# Patient Record
Sex: Female | Born: 1975 | Race: White | Hispanic: No | Marital: Married | Smoking: Current every day smoker
Health system: Southern US, Community
[De-identification: ages and names within clinical notes are randomized; demographics above are authoritative.]

## PROBLEM LIST (undated history)

## (undated) DIAGNOSIS — R87629 Unspecified abnormal cytological findings in specimens from vagina: Secondary | ICD-10-CM

## (undated) DIAGNOSIS — R5383 Other fatigue: Secondary | ICD-10-CM

## (undated) DIAGNOSIS — F17200 Nicotine dependence, unspecified, uncomplicated: Secondary | ICD-10-CM

## (undated) DIAGNOSIS — R079 Chest pain, unspecified: Secondary | ICD-10-CM

## (undated) HISTORY — DX: Nicotine dependence, unspecified, uncomplicated: F17.200

## (undated) HISTORY — DX: Unspecified abnormal cytological findings in specimens from vagina: R87.629

## (undated) HISTORY — PX: OTHER SURGICAL HISTORY: SHX169

## (undated) HISTORY — DX: Chest pain, unspecified: R07.9

## (undated) HISTORY — PX: APPENDECTOMY: SHX54

## (undated) HISTORY — DX: Other fatigue: R53.83

---

## 2003-06-07 ENCOUNTER — Other Ambulatory Visit: Admission: RE | Admit: 2003-06-07 | Discharge: 2003-06-07 | Payer: Self-pay | Admitting: Obstetrics and Gynecology

## 2004-06-10 ENCOUNTER — Other Ambulatory Visit: Admission: RE | Admit: 2004-06-10 | Discharge: 2004-06-10 | Payer: Self-pay | Admitting: Obstetrics and Gynecology

## 2006-05-27 ENCOUNTER — Ambulatory Visit (HOSPITAL_COMMUNITY): Admission: RE | Admit: 2006-05-27 | Discharge: 2006-05-27 | Payer: Self-pay | Admitting: Preventative Medicine

## 2007-07-09 ENCOUNTER — Emergency Department (HOSPITAL_COMMUNITY): Admission: EM | Admit: 2007-07-09 | Discharge: 2007-07-09 | Payer: Self-pay | Admitting: Emergency Medicine

## 2007-11-23 ENCOUNTER — Other Ambulatory Visit: Admission: RE | Admit: 2007-11-23 | Discharge: 2007-11-23 | Payer: Self-pay | Admitting: Obstetrics and Gynecology

## 2007-12-06 ENCOUNTER — Ambulatory Visit (HOSPITAL_COMMUNITY): Admission: RE | Admit: 2007-12-06 | Discharge: 2007-12-06 | Payer: Self-pay | Admitting: Obstetrics & Gynecology

## 2008-03-27 ENCOUNTER — Emergency Department (HOSPITAL_COMMUNITY): Admission: EM | Admit: 2008-03-27 | Discharge: 2008-03-27 | Payer: Self-pay | Admitting: Emergency Medicine

## 2008-04-01 ENCOUNTER — Ambulatory Visit (HOSPITAL_COMMUNITY): Admission: RE | Admit: 2008-04-01 | Discharge: 2008-04-01 | Payer: Self-pay | Admitting: Pediatrics

## 2008-06-13 ENCOUNTER — Ambulatory Visit (HOSPITAL_COMMUNITY): Admission: RE | Admit: 2008-06-13 | Discharge: 2008-06-13 | Payer: Self-pay | Admitting: Pediatrics

## 2008-07-09 ENCOUNTER — Encounter (HOSPITAL_COMMUNITY): Admission: RE | Admit: 2008-07-09 | Discharge: 2008-08-08 | Payer: Self-pay | Admitting: Pediatrics

## 2008-11-10 ENCOUNTER — Emergency Department (HOSPITAL_COMMUNITY): Admission: EM | Admit: 2008-11-10 | Discharge: 2008-11-10 | Payer: Self-pay | Admitting: Emergency Medicine

## 2010-12-28 ENCOUNTER — Encounter: Payer: Self-pay | Admitting: Pediatrics

## 2011-08-31 LAB — PREGNANCY, URINE: Preg Test, Ur: NEGATIVE

## 2012-02-03 ENCOUNTER — Other Ambulatory Visit (HOSPITAL_COMMUNITY): Payer: Self-pay | Admitting: Pediatrics

## 2012-02-03 ENCOUNTER — Ambulatory Visit (HOSPITAL_COMMUNITY)
Admission: RE | Admit: 2012-02-03 | Discharge: 2012-02-03 | Disposition: A | Discharge status: Home / Self Care | Payer: Self-pay | Source: Ambulatory Visit | Attending: Pediatrics | Admitting: Pediatrics

## 2012-02-03 DIAGNOSIS — M549 Dorsalgia, unspecified: Secondary | ICD-10-CM

## 2012-02-03 DIAGNOSIS — M79609 Pain in unspecified limb: Secondary | ICD-10-CM | POA: Insufficient documentation

## 2012-02-03 DIAGNOSIS — M545 Low back pain, unspecified: Secondary | ICD-10-CM | POA: Insufficient documentation

## 2013-01-04 ENCOUNTER — Emergency Department (HOSPITAL_COMMUNITY)
Admission: EM | Admit: 2013-01-04 | Discharge: 2013-01-04 | Disposition: A | Discharge status: Home / Self Care | Payer: Self-pay | Attending: Emergency Medicine | Admitting: Emergency Medicine

## 2013-01-04 ENCOUNTER — Emergency Department (HOSPITAL_COMMUNITY): Payer: Self-pay

## 2013-01-04 ENCOUNTER — Encounter (HOSPITAL_COMMUNITY): Payer: Self-pay

## 2013-01-04 DIAGNOSIS — R6883 Chills (without fever): Secondary | ICD-10-CM | POA: Insufficient documentation

## 2013-01-04 DIAGNOSIS — R0982 Postnasal drip: Secondary | ICD-10-CM | POA: Insufficient documentation

## 2013-01-04 DIAGNOSIS — IMO0001 Reserved for inherently not codable concepts without codable children: Secondary | ICD-10-CM | POA: Insufficient documentation

## 2013-01-04 DIAGNOSIS — F17200 Nicotine dependence, unspecified, uncomplicated: Secondary | ICD-10-CM | POA: Insufficient documentation

## 2013-01-04 DIAGNOSIS — R079 Chest pain, unspecified: Secondary | ICD-10-CM | POA: Insufficient documentation

## 2013-01-04 DIAGNOSIS — J3489 Other specified disorders of nose and nasal sinuses: Secondary | ICD-10-CM | POA: Insufficient documentation

## 2013-01-04 MED ORDER — PROMETHAZINE-CODEINE 6.25-10 MG/5ML PO SYRP: 5.0000 mL | ORAL_SOLUTION | ORAL | Status: DC | PRN

## 2013-01-04 MED ORDER — PSEUDOEPHEDRINE HCL 60 MG PO TABS: ORAL_TABLET | ORAL | Status: DC

## 2013-01-04 NOTE — ED Provider Notes (Signed)
Medical screening examination/treatment/procedure(s) were performed by non-physician practitioner and as supervising physician I was immediately available for consultation/collaboration.   Hindy Perrault M Lynnox Girten, DO 01/04/13 1821 

## 2013-01-04 NOTE — ED Notes (Signed)
Pt reports cough/congestion, and sore throat for about a week.  Pt denies any fever at home.  Pt reports "i feel like i need to clear my throat but cant".

## 2013-01-04 NOTE — ED Provider Notes (Signed)
History     CSN: [id number]  Arrival date & time 01/04/13  1637   First MD Initiated Contact with Patient 01/04/13 1652      Chief Complaint  Patient presents with  . Cough  . Nasal Congestion    (Consider location/radiation/quality/duration/timing/severity/associated sxs/prior treatment) Patient is a 37 y.o. female presenting with cough. The history is provided by the patient.  Cough This is a new problem. The current episode started more than 2 days ago. The problem occurs every few minutes. The problem has not changed since onset.The cough is non-productive. There has been no fever. Associated symptoms include chest pain, chills, rhinorrhea and myalgias. Pertinent negatives include no shortness of breath and no wheezing. She has tried nothing for the symptoms. She is a smoker. Her past medical history is significant for bronchitis. Her past medical history does not include pneumonia or asthma.    History reviewed. No pertinent past medical history.  Past Surgical History  Procedure Date  . Appendectomy     No family history on file.  History  Substance Use Topics  . Smoking status: Current Every Day Smoker  . Smokeless tobacco: Not on file  . Alcohol Use: No    OB History    Grav Para Term Preterm Abortions TAB SAB Ect Mult Living                  Review of Systems  Constitutional: Positive for chills. Negative for activity change.       All ROS Neg except as noted in HPI  HENT: Positive for rhinorrhea. Negative for nosebleeds and neck pain.   Eyes: Negative for photophobia and discharge.  Respiratory: Positive for cough. Negative for shortness of breath and wheezing.   Cardiovascular: Positive for chest pain. Negative for palpitations.  Gastrointestinal: Negative for abdominal pain and blood in stool.  Genitourinary: Negative for dysuria, frequency and hematuria.  Musculoskeletal: Positive for myalgias. Negative for back pain and arthralgias.  Skin: Negative.    Neurological: Negative for dizziness, seizures and speech difficulty.  Psychiatric/Behavioral: Negative for hallucinations and confusion.    Allergies  Review of patient's allergies indicates no known allergies.  Home Medications  No current outpatient prescriptions on file.  BP 127/72  Pulse 83  Temp 97.9 F (36.6 C) (Oral)  Resp 17  Ht 5\' 3"  (1.6 m)  Wt 125 lb (56.7 kg)  BMI 22.14 kg/m2  SpO2 100%  LMP 12/21/2012  Physical Exam  Nursing note and vitals reviewed. Constitutional: She is oriented to person, place, and time. She appears well-developed and well-nourished.  Non-toxic appearance.  HENT:  Head: Normocephalic.  Right Ear: Tympanic membrane and external ear normal.  Left Ear: Tympanic membrane and external ear normal.       Airway patent. Uvula midline. No fb noted. Speech clear  Eyes: EOM and lids are normal. Pupils are equal, round, and reactive to light.  Neck: Normal range of motion. Neck supple. Carotid bruit is not present.       trachea midline  Cardiovascular: Normal rate, regular rhythm, normal heart sounds, intact distal pulses and normal pulses.   Pulmonary/Chest: Breath sounds normal. No respiratory distress.  Abdominal: Soft. Bowel sounds are normal. There is no tenderness. There is no guarding.  Musculoskeletal: Normal range of motion.  Lymphadenopathy:       Head (right side): No submandibular adenopathy present.       Head (left side): No submandibular adenopathy present.    She has no cervical adenopathy.  Neurological: She is alert and oriented to person, place, and time. She has normal strength. No cranial nerve deficit or sensory deficit.  Skin: Skin is warm and dry.  Psychiatric: She has a normal mood and affect. Her speech is normal.    ED Course  Procedures (including critical care time)  Labs Reviewed - No data to display No results found.   No diagnosis found.    MDM  I have reviewed nursing notes, vital signs, and all  appropriate lab and imaging results for this patient.  X-ray of the chest is negative for pneumonia or acute findings. Soft tissue neck x-ray is negative for mass or any other abnormality. Vital signs are stable. The plan at this time is for the patient to increase fluids, wash hands frequently. The patient is given a prescription for Sudafed 3 times daily and promethazine cough medication every 4 hours as needed for cough. Patient is to return if not improving.      Kathie Dike, [place] 01/04/13 [phone]

## 2014-03-14 ENCOUNTER — Other Ambulatory Visit (HOSPITAL_COMMUNITY)
Admission: RE | Admit: 2014-03-14 | Discharge: 2014-03-14 | Disposition: A | Discharge status: Home / Self Care | Payer: 59 | Source: Ambulatory Visit | Attending: Adult Health | Admitting: Adult Health

## 2014-03-14 ENCOUNTER — Ambulatory Visit (INDEPENDENT_AMBULATORY_CARE_PROVIDER_SITE_OTHER): Payer: 59 | Admitting: Adult Health

## 2014-03-14 ENCOUNTER — Encounter: Payer: Self-pay | Admitting: Adult Health

## 2014-03-14 VITALS — BP 118/70 | HR 74 | Ht 63.0 in | Wt 125.0 lb

## 2014-03-14 DIAGNOSIS — Z1151 Encounter for screening for human papillomavirus (HPV): Secondary | ICD-10-CM | POA: Insufficient documentation

## 2014-03-14 DIAGNOSIS — Z01419 Encounter for gynecological examination (general) (routine) without abnormal findings: Secondary | ICD-10-CM | POA: Insufficient documentation

## 2014-03-14 NOTE — Patient Instructions (Signed)
Physical in 1 year Mammogram at 41 Get labs in near future fasting

## 2014-03-14 NOTE — Progress Notes (Signed)
Patient ID: Roberta Castro, female   DOB: 1976/09/01, 38 y.o.   MRN: [id number] History of Present Illness: Roberta Castro is a 38 year old white female, married in for a pap and physical.Has 37 yo son and has not had any more pregnancies and does not use birth control, periods are regular but last only 2 days.   Current Medications, Allergies, Past Medical History, Past Surgical History, Family History and Social History were reviewed in [facility] electronic medical record.     Review of Systems: Patient denies any headaches, blurred vision, shortness of breath, abdominal pain, problems with bowel movements, urination, or intercourse. No joint pain or mood swings, she does have pain in chest at times at work thinks it is stress related.    Physical Exam:BP 118/70  Pulse 74  Ht 5\' 3"  (1.6 m)  Wt 125 lb (56.7 kg)  BMI 22.15 kg/m2  LMP 03/11/2014 General:  Well developed, well nourished, no acute distress Skin:  Warm and dry Neck:  Midline trachea, normal thyroid Lungs; Clear to auscultation bilaterally Breast:  No dominant palpable mass, retraction, or nipple discharge Cardiovascular: Regular rate and rhythm Abdomen:  Soft, non tender, no hepatosplenomegaly Pelvic:  External genitalia is normal in appearance.  The vagina is normal in appearance.  The cervix is smooth. Pap performed with HPV.  Uterus is felt to be normal size, shape, and contour.  No                adnexal masses or tenderness noted. Extremities:  No swelling or varicosities noted Psych:  No mood changes, alert and cooperative seems happy   Impression: Yearly gyn exam   Plan: Physical in 1 year Mammogram at 41 Get fasting labs in near future, CBC,CMP,TSh and lipids

## 2014-03-19 ENCOUNTER — Other Ambulatory Visit: Payer: 59

## 2014-03-19 DIAGNOSIS — Z1329 Encounter for screening for other suspected endocrine disorder: Secondary | ICD-10-CM

## 2014-03-19 DIAGNOSIS — Z0000 Encounter for general adult medical examination without abnormal findings: Secondary | ICD-10-CM

## 2014-03-19 DIAGNOSIS — Z13220 Encounter for screening for lipoid disorders: Secondary | ICD-10-CM

## 2014-03-19 LAB — COMPREHENSIVE METABOLIC PANEL
ALT: 11 U/L (ref 0–35)
AST: 14 U/L (ref 0–37)
Albumin: 4 g/dL (ref 3.5–5.2)
Alkaline Phosphatase: 42 U/L (ref 39–117)
BUN: 12 mg/dL (ref 6–23)
CALCIUM: 9.3 mg/dL (ref 8.4–10.5)
CHLORIDE: 103 meq/L (ref 96–112)
CO2: 30 mEq/L (ref 19–32)
Creat: 0.69 mg/dL (ref 0.50–1.10)
Glucose, Bld: 89 mg/dL (ref 70–99)
POTASSIUM: 4.5 meq/L (ref 3.5–5.3)
Sodium: 138 mEq/L (ref 135–145)
Total Bilirubin: 0.4 mg/dL (ref 0.2–1.2)
Total Protein: 6.6 g/dL (ref 6.0–8.3)

## 2014-03-19 LAB — CBC
HEMATOCRIT: 40.9 % (ref 36.0–46.0)
HEMOGLOBIN: 13.8 g/dL (ref 12.0–15.0)
MCH: 29.4 pg (ref 26.0–34.0)
MCHC: 33.7 g/dL (ref 30.0–36.0)
MCV: 87 fL (ref 78.0–100.0)
Platelets: 263 10*3/uL (ref 150–400)
RBC: 4.7 MIL/uL (ref 3.87–5.11)
RDW: 13.3 % (ref 11.5–15.5)
WBC: 6.9 10*3/uL (ref 4.0–10.5)

## 2014-03-19 LAB — LIPID PANEL
CHOLESTEROL: 116 mg/dL (ref 0–200)
HDL: 40 mg/dL (ref >39)
LDL Cholesterol: 63 mg/dL (ref 0–99)
TRIGLYCERIDES: 65 mg/dL (ref <150)
Total CHOL/HDL Ratio: 2.9 Ratio
VLDL: 13 mg/dL (ref 0–40)

## 2014-03-19 LAB — TSH: TSH: 1.883 u[IU]/mL (ref 0.350–4.500)

## 2014-03-20 ENCOUNTER — Telehealth: Payer: Self-pay | Admitting: Adult Health

## 2014-03-20 NOTE — Telephone Encounter (Signed)
Left message labs great

## 2014-04-15 ENCOUNTER — Emergency Department (HOSPITAL_COMMUNITY)
Admission: EM | Admit: 2014-04-15 | Discharge: 2014-04-15 | Disposition: A | Discharge status: Home / Self Care | Payer: 59 | Attending: Emergency Medicine | Admitting: Emergency Medicine

## 2014-04-15 ENCOUNTER — Emergency Department (HOSPITAL_COMMUNITY): Payer: 59

## 2014-04-15 ENCOUNTER — Encounter (HOSPITAL_COMMUNITY): Payer: Self-pay | Admitting: Emergency Medicine

## 2014-04-15 DIAGNOSIS — J329 Chronic sinusitis, unspecified: Secondary | ICD-10-CM

## 2014-04-15 DIAGNOSIS — J40 Bronchitis, not specified as acute or chronic: Secondary | ICD-10-CM

## 2014-04-15 DIAGNOSIS — F17200 Nicotine dependence, unspecified, uncomplicated: Secondary | ICD-10-CM | POA: Insufficient documentation

## 2014-04-15 DIAGNOSIS — H6692 Otitis media, unspecified, left ear: Secondary | ICD-10-CM

## 2014-04-15 DIAGNOSIS — H6690 Otitis media, unspecified, unspecified ear: Secondary | ICD-10-CM | POA: Insufficient documentation

## 2014-04-15 DIAGNOSIS — J209 Acute bronchitis, unspecified: Secondary | ICD-10-CM | POA: Insufficient documentation

## 2014-04-15 MED ORDER — AMOXICILLIN-POT CLAVULANATE 875-125 MG PO TABS: 1.0000 | ORAL_TABLET | Freq: Two times a day (BID) | ORAL | Status: DC

## 2014-04-15 MED ORDER — LORATADINE 10 MG PO TABS: 10.0000 mg | ORAL_TABLET | Freq: Every day | ORAL | Status: DC

## 2014-04-15 NOTE — ED Provider Notes (Signed)
CSN: [id number]     Arrival date & time 04/15/14  [id number] History   First MD Initiated Contact with Patient 04/15/14 [phone]     Chief Complaint  Patient presents with  . URI     (Consider location/radiation/quality/duration/timing/severity/associated sxs/prior Treatment) Patient is a 38 y.o. female presenting with URI. The history is provided by the patient.  URI Presenting symptoms: congestion, cough, ear pain and facial pain   Presenting symptoms: no fever (?) and no sore throat   Severity:  Severe Onset quality:  Gradual Duration:  1 week Timing:  Constant Progression:  Worsening Chronicity:  New Relieved by:  Nothing Worsened by:  Nothing tried Associated symptoms: headaches (around left eye), myalgias, sinus pain, sneezing and wheezing   Associated symptoms: no swollen glands   Risk factors: no chronic cardiac disease, no chronic kidney disease, no chronic respiratory disease, no diabetes mellitus, no recent travel and no sick contacts    Roberta Castro is a 38 y.o. female who presents to the ED with cough and congestion and left ear pressure and ringing. She has taken several OTC medications without relief. The cough is productive green sputum. She is currently in the process of getting a PCP. 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   Past Medical History  Diagnosis Date  . Vaginal Pap smear, abnormal    Past Surgical History  Procedure Laterality Date  . Appendectomy    . Tubes in ears     Family History  Problem Relation Age of Onset  . Hypertension Mother   . Kidney disease Mother   . Depression Mother   . COPD Mother   . Arthritis Mother   . Cancer Sister     breast  . Cancer Maternal Aunt     breast  . Cancer Maternal Grandmother     breast, lung, throat  . Cancer Sister     cervical   History  Substance Use Topics  . Smoking status: Current Every Day Smoker -- 0.50 packs/day for 20 years    Types: Cigarettes  . Smokeless tobacco: Never Used   . Alcohol Use: No     Comment: occ.   OB History   Grav Para Term Preterm Abortions TAB SAB Ect Mult Living   1 1        1      Review of Systems  Constitutional: Negative for fever (?) and chills.  HENT: Positive for congestion, ear pain and sneezing. Negative for sore throat.   Eyes: Negative for visual disturbance.  Respiratory: Positive for cough and wheezing.   Gastrointestinal: Negative for nausea, vomiting and abdominal pain.  Genitourinary: Negative for dysuria, urgency and frequency.  Musculoskeletal: Positive for myalgias.  Skin: Negative for rash.  Neurological: Positive for light-headedness and headaches (around left eye).  Psychiatric/Behavioral: Negative for confusion. The patient is not nervous/anxious.       Allergies  Review of patient's allergies indicates no known allergies.  Home Medications   Prior to Admission medications   Not on File   There were no vitals taken for this visit. Physical Exam  Nursing note and vitals reviewed. Constitutional: She is oriented to person, place, and time. She appears well-developed and well-nourished. No distress.  HENT:  Head: Normocephalic.  Right Ear: Tympanic membrane normal.  Left Ear: Tympanic membrane is erythematous and bulging.  Nose: Mucosal edema present. Left sinus exhibits maxillary sinus tenderness.  Mouth/Throat: Uvula is midline, oropharynx is clear and moist and mucous  membranes are normal.  Eyes: EOM are normal.  Neck: Neck supple.  Cardiovascular: Normal rate, regular rhythm and normal heart sounds.   Pulmonary/Chest: Effort normal. Wheezes: occasional. Rhonchi: occasional.  Abdominal: Soft. There is no tenderness.  Musculoskeletal: Normal range of motion.  Neurological: She is alert and oriented to person, place, and time. No cranial nerve deficit.  Skin: Skin is warm and dry.  Psychiatric: She has a normal mood and affect. Her behavior is normal.   Dg Chest 2 View  04/15/2014   CLINICAL  DATA:  Cough  EXAM: CHEST  2 VIEW  COMPARISON:  January 04, 2013  FINDINGS: Lungs are clear. Heart size and pulmonary vascularity are normal. No adenopathy. No bone lesions.  IMPRESSION: No abnormality noted.   Electronically Signed   By: Bretta BangWilliam  Woodruff M.D.   On: 04/15/2014 10:24    ED Course  Procedures   MDM  38 y.o. female with sinusitis, bronchitis and otitis media left. Will treat symptoms and she will return for worsening symptoms. Stable for discharge without fever, neck pain or signs of sepsis at this time. I have reviewed this patient's vital signs, nurses notes, appropriate labs and imaging.  I have discussed findings with the patient and plan of care and she voices understanding and agrees with the plan.    Medication List    TAKE these medications       amoxicillin-clavulanate 875-125 MG per tablet  Commonly known as:  AUGMENTIN  Take 1 tablet by mouth 2 (two) times daily.     loratadine 10 MG tablet  Commonly known as:  CLARITIN  Take 1 tablet (10 mg total) by mouth daily.      ASK your doctor about these medications       dextromethorphan-guaiFENesin 30-600 MG per 12 hr tablet  Commonly known as:  MUCINEX DM  Take 1 tablet by mouth 2 (two) times daily.     diphenhydrAMINE 25 MG tablet  Commonly known as:  BENADRYL  Take 25 mg by mouth daily as needed for allergies.     ibuprofen 200 MG tablet  Commonly known as:  ADVIL,MOTRIN  Take 400 mg by mouth every 8 (eight) hours as needed for headache or moderate pain.           Janne NapoleonHope M Neese, [place] 04/15/14 [phone]

## 2014-04-15 NOTE — Care Management Note (Signed)
ED/CM noted patient did not have health insurance and/or PCP listed in the computer.  Patient was given the [place] resource handout with information on the clinics, food pantries, and the handout for new health insurance sign-up.  Patient expressed appreciation for information received. 

## 2014-04-15 NOTE — ED Notes (Signed)
Pt reports cold symptoms x 1 week.  Today reports can't hear out of left ear.  Denies fever.

## 2014-04-16 NOTE — ED Provider Notes (Signed)
Medical screening examination/treatment/procedure(s) were performed by non-physician practitioner and as supervising physician I was immediately available for consultation/collaboration.   EKG Interpretation None        Joya Gaskinsonald W Janann Boeve, MD 04/16/14 1243

## 2014-10-07 ENCOUNTER — Encounter (HOSPITAL_COMMUNITY): Payer: Self-pay | Admitting: Emergency Medicine

## 2015-11-17 ENCOUNTER — Encounter (HOSPITAL_COMMUNITY): Payer: Self-pay

## 2015-11-17 ENCOUNTER — Emergency Department (HOSPITAL_COMMUNITY)
Admission: EM | Admit: 2015-11-17 | Discharge: 2015-11-17 | Disposition: A | Discharge status: Home / Self Care | Payer: Self-pay | Attending: Emergency Medicine | Admitting: Emergency Medicine

## 2015-11-17 DIAGNOSIS — F17210 Nicotine dependence, cigarettes, uncomplicated: Secondary | ICD-10-CM | POA: Insufficient documentation

## 2015-11-17 DIAGNOSIS — H6593 Unspecified nonsuppurative otitis media, bilateral: Secondary | ICD-10-CM

## 2015-11-17 DIAGNOSIS — Z79899 Other long term (current) drug therapy: Secondary | ICD-10-CM | POA: Insufficient documentation

## 2015-11-17 DIAGNOSIS — J019 Acute sinusitis, unspecified: Secondary | ICD-10-CM

## 2015-11-17 MED ORDER — GUAIFENESIN-CODEINE 100-10 MG/5ML PO SYRP: 10.0000 mL | ORAL_SOLUTION | Freq: Three times a day (TID) | ORAL | Status: DC | PRN

## 2015-11-17 MED ORDER — AMOXICILLIN-POT CLAVULANATE 875-125 MG PO TABS: 1.0000 | ORAL_TABLET | Freq: Two times a day (BID) | ORAL | Status: DC

## 2015-11-17 NOTE — ED Notes (Signed)
Pt complain of sinus pressure and headache. States she has been taking OTC meds be they are not working

## 2015-11-17 NOTE — Discharge Instructions (Signed)
Otitis Media, Adult °Otitis media is redness, soreness, and puffiness (swelling) in the space just behind your eardrum (middle ear). It may be caused by allergies or infection. It often happens along with a cold. °HOME CARE °· Take your medicine as told. Finish it even if you start to feel better. °· Only take over-the-counter or prescription medicines for pain, discomfort, or fever as told by your doctor. °· Follow up with your doctor as told. °GET HELP IF: °· You have otitis media only in one ear, or bleeding from your nose, or both. °· You notice a lump on your neck. °· You are not getting better in 3-5 days. °· You feel worse instead of better. °GET HELP RIGHT AWAY IF:  °· You have pain that is not helped with medicine. °· You have puffiness, redness, or pain around your ear. °· You get a stiff neck. °· You cannot move part of your face (paralysis). °· You notice that the bone behind your ear hurts when you touch it. °MAKE SURE YOU:  °· Understand these instructions. °· Will watch your condition. °· Will get help right away if you are not doing well or get worse. °  °This information is not intended to replace advice given to you by your health care provider. Make sure you discuss any questions you have with your health care provider. °  °Document Released: 05/10/2008 Document Revised: 12/13/2014 Document Reviewed: 06/19/2013 °Elsevier Interactive Patient Education ©2016 Elsevier Inc. ° °Sinusitis, Adult °Sinusitis is redness, soreness, and puffiness (inflammation) of the air pockets in the bones of your face (sinuses). The redness, soreness, and puffiness can cause air and mucus to get trapped in your sinuses. This can allow germs to grow and cause an infection.  °HOME CARE  °· Drink enough fluids to keep your pee (urine) clear or pale yellow. °· Use a humidifier in your home. °· Run a hot shower to create steam in the bathroom. Sit in the bathroom with the door closed. Breathe in the steam 3-4 times a  day. °· Put a warm, moist washcloth on your face 3-4 times a day, or as told by your doctor. °· Use salt water sprays (saline sprays) to wet the thick fluid in your nose. This can help the sinuses drain. °· Only take medicine as told by your doctor. °GET HELP RIGHT AWAY IF:  °· Your pain gets worse. °· You have very bad headaches. °· You are sick to your stomach (nauseous). °· You throw up (vomit). °· You are very sleepy (drowsy) all the time. °· Your face is puffy (swollen). °· Your vision changes. °· You have a stiff neck. °· You have trouble breathing. °MAKE SURE YOU:  °· Understand these instructions. °· Will watch your condition. °· Will get help right away if you are not doing well or get worse. °  °This information is not intended to replace advice given to you by your health care provider. Make sure you discuss any questions you have with your health care provider. °  °Document Released: 05/10/2008 Document Revised: 12/13/2014 Document Reviewed: 06/27/2012 °Elsevier Interactive Patient Education ©2016 Elsevier Inc. ° °

## 2015-11-19 NOTE — ED Provider Notes (Signed)
CSN: [id number]     Arrival date & time 11/17/15  [id number] History   First MD Initiated Contact with Patient 11/17/15 [phone]     Chief Complaint  Patient presents with  . Facial Pain     (Consider location/radiation/quality/duration/timing/severity/associated sxs/prior Treatment) HPI  Roberta Castro is a 39 y.o. female who presents to the Emergency Department complaining of sinus pressure, ear pain, and facial pain for several days.  She describes pressure to her face and blowing her nose frequently.  She also reports associated frontal headaches, congestion and occasional cough.  She has been using OTC cough and cold medications.  She denies chest pain, shortness of breath, neck pain or stiffness,fever, chills, and dental symptoms.   Past Medical History  Diagnosis Date  . Vaginal Pap smear, abnormal    Past Surgical History  Procedure Laterality Date  . Appendectomy    . Tubes in ears     Family History  Problem Relation Age of Onset  . Hypertension Mother   . Kidney disease Mother   . Depression Mother   . COPD Mother   . Arthritis Mother   . Cancer Sister     breast  . Cancer Maternal Aunt     breast  . Cancer Maternal Grandmother     breast, lung, throat  . Cancer Sister     cervical   Social History  Substance Use Topics  . Smoking status: Current Every Day Smoker -- 0.50 packs/day for 20 years    Types: Cigarettes  . Smokeless tobacco: Never Used  . Alcohol Use: No     Comment: occ.   OB History    Gravida Para Term Preterm AB TAB SAB Ectopic Multiple Living   1 1        1      Review of Systems  Constitutional: Negative for fever, chills, activity change and appetite change.  HENT: Positive for congestion, ear pain, rhinorrhea and sinus pressure. Negative for facial swelling, sore throat and trouble swallowing.   Eyes: Negative for visual disturbance.  Respiratory: Positive for cough. Negative for shortness of breath, wheezing and stridor.    Gastrointestinal: Negative for nausea and vomiting.  Musculoskeletal: Negative for neck pain and neck stiffness.  Skin: Negative.   Neurological: Positive for headaches. Negative for dizziness, weakness and numbness.  Hematological: Negative for adenopathy.  Psychiatric/Behavioral: Negative for confusion.  All other systems reviewed and are negative.     Allergies  Review of patient's allergies indicates no known allergies.  Home Medications   Prior to Admission medications   Medication Sig Start Date End Date Taking? Authorizing Provider  Phenylephrine-DM-GG-APAP (TYLENOL COLD MULTI-SYMPTOM) 5-10-200-325 MG TABS Take 2 capsules by mouth daily.   Yes Historical Provider, MD  Pseudoephedrine-APAP-DM (DAYQUIL PO) Take 1 capsule by mouth daily.   Yes Historical Provider, MD  amoxicillin-clavulanate (AUGMENTIN) 875-125 MG tablet Take 1 tablet by mouth 2 (two) times daily. For 7 days 11/17/15   Taiwan Millon, PA-C  guaiFENesin-codeine (ROBITUSSIN AC) 100-10 MG/5ML syrup Take 10 mLs by mouth 3 (three) times daily as needed. 11/17/15   Avalyn Molino, PA-C   BP 97/70 mmHg  Pulse 89  Temp(Src) 98.1 F (36.7 C) (Oral)  Resp 16  Ht 5\' 3"  (1.6 m)  Wt 53.524 kg  BMI 20.91 kg/m2  SpO2 96%  LMP 11/10/2015 Physical Exam  Constitutional: She is oriented to person, place, and time. She appears well-developed and well-nourished. No distress.  HENT:  Head: Normocephalic and atraumatic.  Right Ear: Ear canal normal. Tympanic membrane is erythematous. Tympanic membrane is not perforated.  Left Ear: Ear canal normal. Tympanic membrane is erythematous. Tympanic membrane is not perforated. A middle ear effusion is present.  Nose: Mucosal edema and rhinorrhea present. Right sinus exhibits maxillary sinus tenderness. Left sinus exhibits maxillary sinus tenderness.  Mouth/Throat: Uvula is midline and mucous membranes are normal. No trismus in the jaw. No uvula swelling. Posterior oropharyngeal  erythema present. No oropharyngeal exudate, posterior oropharyngeal edema or tonsillar abscesses.  Eyes: Conjunctivae are normal.  Neck: Normal range of motion and phonation normal. Neck supple. No tracheal deviation present. No Brudzinski's sign and no Kernig's sign noted.  Cardiovascular: Normal rate, regular rhythm and intact distal pulses.   Pulmonary/Chest: Effort normal and breath sounds normal. No respiratory distress. She has no wheezes. She has no rales.  Abdominal: Soft. She exhibits no distension. There is no tenderness. There is no rebound and no guarding.  Musculoskeletal: She exhibits no edema.  Lymphadenopathy:    She has no cervical adenopathy.  Neurological: She is alert and oriented to person, place, and time. She exhibits normal muscle tone. Coordination normal.  Skin: Skin is warm and dry.  Psychiatric: She has a normal mood and affect.  Nursing note and vitals reviewed.   ED Course  Procedures (including critical care time) Labs Review Labs Reviewed - No data to display  Imaging Review No results found. I have personally reviewed and evaluated these images and lab results as part of my medical decision-making.    MDM   Final diagnoses:  Bilateral non-suppurative otitis media, recurrence not specified  Acute sinusitis, recurrence not specified, unspecified location    Pt well appearing, vitals stable.  Pt has bilateral OM and likely sinusitis, no concerning sx's for orbital cellulitis, meningitis or dental problem.  Rx for augmentin and robitussin AC.  Pt agrees to close PMD f/u or to return here if needed.  Appears stable for d/c    Pauline Aus, PA-C 11/19/15 1208  Eber Hong, MD 11/19/15 [phone]

## 2016-02-10 ENCOUNTER — Ambulatory Visit (INDEPENDENT_AMBULATORY_CARE_PROVIDER_SITE_OTHER): Payer: Self-pay | Admitting: Adult Health

## 2016-02-10 ENCOUNTER — Encounter: Payer: Self-pay | Admitting: Adult Health

## 2016-02-10 VITALS — BP 130/78 | HR 90 | Ht 63.5 in | Wt 124.0 lb

## 2016-02-10 DIAGNOSIS — Z01411 Encounter for gynecological examination (general) (routine) with abnormal findings: Secondary | ICD-10-CM

## 2016-02-10 DIAGNOSIS — F17200 Nicotine dependence, unspecified, uncomplicated: Secondary | ICD-10-CM

## 2016-02-10 DIAGNOSIS — R079 Chest pain, unspecified: Secondary | ICD-10-CM

## 2016-02-10 DIAGNOSIS — Z720 Tobacco use: Secondary | ICD-10-CM

## 2016-02-10 DIAGNOSIS — Z01419 Encounter for gynecological examination (general) (routine) without abnormal findings: Secondary | ICD-10-CM

## 2016-02-10 DIAGNOSIS — R5383 Other fatigue: Secondary | ICD-10-CM | POA: Insufficient documentation

## 2016-02-10 HISTORY — DX: Nicotine dependence, unspecified, uncomplicated: F17.200

## 2016-02-10 HISTORY — DX: Chest pain, unspecified: R07.9

## 2016-02-10 HISTORY — DX: Other fatigue: R53.83

## 2016-02-10 NOTE — Patient Instructions (Signed)
If has chest pain go to ER Referred to Dr Gracy RacerMc Dowell Mammogram at 41  Pap and physical in 1 year

## 2016-02-10 NOTE — Progress Notes (Signed)
Patient ID: Roberta Castro, female   DOB: December 19, 1975, 40 y.o.   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 She is self pay today.  Current Medications, Allergies, Past Medical History, Past Surgical History, Family History and Social History were reviewed in [facility] electronic medical record.     Review of Systems: Patient denies any headaches, hearing loss,  blurred vision, abdominal pain, problems with bowel movements, urination, or intercourse. No joint pain or mood swings. See HPI for positives.    Physical Exam:BP 130/78 mmHg  Pulse 90  Ht 5' 3.5" (1.613 m)  Wt 124 lb (56.246 kg)  BMI 21.62 kg/m2  LMP 01/26/2016 General:  Well developed, well nourished, no acute distress Skin:  Warm and dry Neck:  Midline trachea, normal thyroid, good ROM, no lymphadenopathy Lungs; Clear to auscultation bilaterally Breast:  No dominant palpable mass, retraction, or nipple discharge Cardiovascular: Regular rate and rhythm Abdomen:  Soft, non tender, no hepatosplenomegaly Pelvic:  External genitalia is normal in appearance, no lesions.  The vagina is normal in appearance. Urethra has no lesions or masses. The cervix is bulbous and smooth.  Uterus is felt to be normal size, shape, and contour.  No adnexal masses or tenderness noted.Bladder is non tender, no masses felt. Extremities/musculoskeletal:  No swelling or varicosities noted, no clubbing, feet were purple when dependant and cool to touch but had good blanching and pulses, hands cool but not purple today, she has good gait and balance  Psych:  No  mood changes, alert and cooperative,seems happy Had fasting labs in 2015 and were excellent.Encouraged to stop smoking or at least cut down, and will refer to cardiologist and gave her forms to apply [facility].  Impression: Well woman gyn exam no pap Smoker Fatigue Chest pain with exertion    Plan: Apply for [facility] Referred to Dr Diona BrownerMcDowell Pap and physical in 1 year Mammogram at 41  If has chest pain again go to ER, explained that women do not have same symptoms as men

## 2016-03-17 ENCOUNTER — Ambulatory Visit (INDEPENDENT_AMBULATORY_CARE_PROVIDER_SITE_OTHER): Payer: Self-pay | Admitting: Cardiology

## 2016-03-17 ENCOUNTER — Encounter: Payer: Self-pay | Admitting: Cardiology

## 2016-03-17 VITALS — BP 128/72 | HR 84 | Ht 63.0 in | Wt 122.0 lb

## 2016-03-17 DIAGNOSIS — Z720 Tobacco use: Secondary | ICD-10-CM

## 2016-03-17 DIAGNOSIS — R072 Precordial pain: Secondary | ICD-10-CM

## 2016-03-17 NOTE — Progress Notes (Signed)
Cardiology Office Note  Date: 03/17/2016   ID: Roberta Castro, DOB 22-Oct-1976, MRN [id number]  Referring provider: Cyril Mourning NP  Consulting Cardiologist: Nona Dell, MD   Chief Complaint  Patient presents with  . Chest discomfort    History of Present Illness: Roberta Castro is a 40 y.o. female referred for cardiology consultation by Ms. Roberta Lucks NP. I reviewed her chart including office note from OB/GYN visit in March. She presents describing approximately six-month history of intermittent chest discomfort with exertion, moderate intensity. 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 When she gets up in the mornings she sometimes feels that her heart is pounding and she has sweating. No chest pain at that time specifically. She has had no syncope.  She does have a history of tobacco use over the years, otherwise no personal history of hypertension, diabetes mellitus, or hyperlipidemia. No definite premature CAD in her family.  I reviewed her ECG today which shows normal sinus rhythm. She has not undergone any previous ischemic testing.  Past Medical History  Diagnosis Date  . Vaginal Pap smear, abnormal   . Smoker 02/10/2016  . Fatigue 02/10/2016  . Chest pain on exertion 02/10/2016    Past Surgical History  Procedure Laterality Date  . Appendectomy    . Tubes in ears      Current Outpatient Prescriptions  Medication Sig Dispense Refill  . acetaminophen (TYLENOL) 500 MG tablet Take 500 mg by mouth every 6 (six) hours as needed.     No current facility-administered medications for this visit.   Allergies:  Review of patient's allergies indicates no known allergies.   Social History: The patient  reports that she has been smoking Cigarettes.  She started smoking about 24 years ago. She has a 10 pack-year smoking history. She has never used smokeless tobacco. She reports that she drinks alcohol. She reports that she  does not use illicit drugs.   Family History: The patient's family history includes Arthritis in her mother; COPD in her mother; Cancer in her maternal aunt, maternal grandmother, sister, and sister; Depression in her mother; Hypertension in her mother; Kidney disease in her mother.   ROS:  Please see the history of present illness. Otherwise, complete review of systems is positive for none.  All other systems are reviewed and negative.   Physical Exam: VS:  BP 128/72 mmHg  Pulse 84  Ht  (1.6 m)  Wt 122 lb (55.339 kg)  BMI 21.62 kg/m2  SpO2 99%  LMP 02/18/2016, BMI Body mass index is 21.62 kg/(m^2).  Wt Readings from Last 3 Encounters:  03/17/16 122 lb (55.339 kg)  02/10/16 124 lb (56.246 kg)  11/17/15 118 lb (53.524 kg)    General: Normally nourished appearing woman, comfortable at rest. HEENT: Conjunctiva and lids normal, oropharynx clear. Neck: Supple, no elevated JVP or carotid bruits, no thyromegaly. Lungs: Clear to auscultation, nonlabored breathing at rest. Cardiac: Regular rate and rhythm, no S3 or significant systolic murmur, no pericardial rub. Abdomen: Soft, nontender, bowel sounds present. Extremities: No pitting edema, distal pulses 2+. Skin: Warm and dry. Musculoskeletal: No kyphosis. Neuropsychiatric: Alert and oriented x3, affect grossly appropriate.  ECG: No prior tracing for comparison.  Recent Labwork:     Component Value Date/Time   CHOL 116 03/19/2014 0934   TRIG 65 03/19/2014 0934   HDL 40 03/19/2014 0934   CHOLHDL 2.9 03/19/2014 0934   VLDL 13 03/19/2014 0934  LDLCALC 63 03/19/2014 0934    Other Studies Reviewed Today:  Chest x-ray 04/15/2014: FINDINGS: Lungs are clear. Heart size and pulmonary vascularity are normal. No adenopathy. No bone lesions.  IMPRESSION: No abnormality noted.  Assessment and Plan:  1. Intermittent exertional chest pain as outlined. Main cardiac risk  factor is tobacco abuse. Baseline. ECG today is normal. We  discussed smoking cessation strategies. Also recommended an exercise echocardiogram for basic ischemic evaluation.  2. Description of forceful heartbeat in the morning when she gets up, no major progressive palpitations during the daytime or syncope. If symptoms worsen, could consider a cardiac monitor.  3. Tobacco abuse, we discussed smoking cessation strategies.  Current medicines were reviewed with the patient today.   Orders Placed This Encounter  Procedures  . EKG 12-Lead  . Echo stress    Disposition: Call with results.   Signed, Jonelle SidleSamuel G. Lashena Signer, MD, [facility] 03/17/2016 11:01 AM    [facility] [address], [place], [place] [address] Phone: [phone]) [phone]; Fax: [phone]) [phone]

## 2016-03-17 NOTE — Patient Instructions (Signed)
Your physician recommends that you schedule a follow-up appointment in:  To be determined after test   Your physician has requested that you have a stress echocardiogram. For further information please visit [email or web address]. Please follow instruction sheet as given. We will call you with results.      Thank you for choosing [facility] !

## 2016-03-25 ENCOUNTER — Ambulatory Visit (HOSPITAL_COMMUNITY)
Admission: RE | Admit: 2016-03-25 | Discharge: 2016-03-25 | Disposition: A | Discharge status: Home / Self Care | Payer: Self-pay | Source: Ambulatory Visit | Attending: Cardiology | Admitting: Cardiology

## 2016-03-25 DIAGNOSIS — R072 Precordial pain: Secondary | ICD-10-CM | POA: Insufficient documentation

## 2016-03-25 LAB — ECHOCARDIOGRAM STRESS TEST
CHL RATE OF PERCEIVED EXERTION: 15
CSEPED: 10 min
CSEPEDS: 10 s
CSEPEW: 13.1 METS
CSEPPHR: 171 {beats}/min
MPHR: 182 {beats}/min
Percent HR: 93 %
Rest HR: 85 {beats}/min

## 2016-03-25 NOTE — Progress Notes (Signed)
*  PRELIMINARY RESULTS* Echocardiogram Echocardiogram Stress Test has been performed.  Roberta Castro, Roberta Castro 03/25/2016, 12:34 PM

## 2016-11-21 ENCOUNTER — Emergency Department (HOSPITAL_COMMUNITY): Payer: Self-pay

## 2016-11-21 ENCOUNTER — Emergency Department (HOSPITAL_COMMUNITY)
Admission: EM | Admit: 2016-11-21 | Discharge: 2016-11-21 | Disposition: A | Discharge status: Home / Self Care | Payer: Self-pay | Attending: Emergency Medicine | Admitting: Emergency Medicine

## 2016-11-21 ENCOUNTER — Encounter (HOSPITAL_COMMUNITY): Payer: Self-pay | Admitting: Emergency Medicine

## 2016-11-21 DIAGNOSIS — J209 Acute bronchitis, unspecified: Secondary | ICD-10-CM | POA: Insufficient documentation

## 2016-11-21 DIAGNOSIS — F17210 Nicotine dependence, cigarettes, uncomplicated: Secondary | ICD-10-CM | POA: Insufficient documentation

## 2016-11-21 MED ORDER — AZITHROMYCIN 250 MG PO TABS: 250.0000 mg | ORAL_TABLET | Freq: Every day | ORAL | 0 refills | Status: DC

## 2016-11-21 MED ORDER — BENZONATATE 100 MG PO CAPS: 100.0000 mg | ORAL_CAPSULE | Freq: Three times a day (TID) | ORAL | 0 refills | Status: DC

## 2016-11-21 NOTE — ED Provider Notes (Signed)
[facility]-EMERGENCY DEPT Provider Note   CSN: [id number] Arrival date & time: 11/21/16  1132  By signing my name below, I, Doreatha MartinEva Mathews, attest that this documentation has been prepared under the direction and in the presence of Leslie K. Sofia, PA-C. Electronically Signed: Doreatha MartinEva Mathews, ED Scribe. 11/21/16. 12:35 PM.    History   Chief Complaint Chief Complaint  Patient presents with  . Cough    HPI Roberta Castro is a 40 y.o. female who presents to the Emergency Department complaining of intermittent productive cough with green sputum x 4 days with associated sore throat, nasal congestion, generalized myalgias. Pt states she has tried OTC decongestants and medication with no relief. No worsening factors noted. Pt is a current smoker (since age 40), 0.5 ppd. She denies fever.   The history is provided by the patient. No language interpreter was used.    Past Medical History:  Diagnosis Date  . Chest pain on exertion 02/10/2016  . Fatigue 02/10/2016  . Smoker 02/10/2016  . Vaginal Pap smear, abnormal     Patient Active Problem List   Diagnosis Date Noted  . Smoker 02/10/2016  . Fatigue 02/10/2016  . Chest pain on exertion 02/10/2016    Past Surgical History:  Procedure Laterality Date  . APPENDECTOMY    . tubes in ears      OB History    Gravida Para Term Preterm AB Living   1 1       1    SAB TAB Ectopic Multiple Live Births           1       Home Medications    Prior to Admission medications   Medication Sig Start Date End Date Taking? Authorizing Provider  acetaminophen (TYLENOL) 500 MG tablet Take 500 mg by mouth every 6 (six) hours as needed.    Historical Provider, MD    Family History Family History  Problem Relation Age of Onset  . Hypertension Mother   . Kidney disease Mother   . Depression Mother   . COPD Mother   . Arthritis Mother   . Cancer Sister     breast  . Cancer Maternal Grandmother     breast, lung, throat  . Cancer Sister     cervical    . Cancer Maternal Aunt     breast    Social History Social History  Substance Use Topics  . Smoking status: Current Every Day Smoker    Packs/day: 0.50    Years: 20.00    Types: Cigarettes    Start date: 03/17/1992  . Smokeless tobacco: Never Used  . Alcohol use 0.0 oz/week     Comment: occ.     Allergies   Patient has no known allergies.   Review of Systems Review of Systems  Constitutional: Negative for fever.  HENT: Positive for congestion and sore throat.   Respiratory: Positive for cough.   Musculoskeletal: Positive for myalgias.  All other systems reviewed and are negative.    Physical Exam Updated Vital Signs BP 117/99 (BP Location: Right Arm)   Pulse 84   Temp 98.7 F (37.1 C) (Oral)   Resp 14   Ht 5\' 3"  (1.6 m)   Wt 120 lb (54.4 kg)   LMP 11/07/2016   SpO2 100%   BMI 21.26 kg/m   Physical Exam  Constitutional: She appears well-developed and well-nourished.  HENT:  Head: Normocephalic.  Eyes: Conjunctivae are normal.  Cardiovascular: Normal rate.   Pulmonary/Chest: Effort  normal. No respiratory distress.  Diffuse rhonchi  Abdominal: She exhibits no distension.  Musculoskeletal: Normal range of motion.  Neurological: She is alert.  Skin: Skin is warm and dry.  Psychiatric: She has a normal mood and affect. Her behavior is normal.  Nursing note and vitals reviewed.    ED Treatments / Results   DIAGNOSTIC STUDIES: Oxygen Saturation is 100% on RA, normal by my interpretation.    COORDINATION OF CARE: 12:33 PM Discussed treatment plan with pt at bedside which includes CXR, antibiotics and pt agreed to plan.    Labs (all labs ordered are listed, but only abnormal results are displayed) Labs Reviewed - No data to display  EKG  EKG Interpretation None       Radiology Dg Chest 2 View  Result Date: 11/21/2016 CLINICAL DATA:  Cough and fever for the past 4 days. Weakness. Smoker. EXAM: CHEST  2 VIEW COMPARISON:  04/15/2014.  FINDINGS: Normal sized heart. Clear lungs. The lungs remain mildly hyperexpanded with mild diffuse peribronchial thickening and accentuation of the interstitial markings. A left nipple shadow is noted on the frontal view, confirmed on the lateral view. Unremarkable bones. IMPRESSION: No acute abnormality.  Mild changes of COPD and chronic bronchitis. Electronically Signed   By: Beckie SaltsSteven  Reid M.D.   On: 11/21/2016 12:08    Procedures Procedures (including critical care time)  Medications Ordered in ED Medications - No data to display   Initial Impression / Assessment and Plan / ED Course  I have reviewed the triage vital signs and the nursing notes.  Pertinent labs & imaging results that were available during my care of the patient were reviewed by me and considered in my medical decision making (see chart for details).  Clinical Course     No outpatient prescriptions have been marked as taking for the 11/21/16 encounter [facility](Hospital Encounter).    Final Clinical Impressions(s) / ED Diagnoses   Final diagnoses:  Acute bronchitis, unspecified organism    New Prescriptions Discharge Medication List as of 11/21/2016 12:38 PM    START taking these medications   Details  azithromycin (ZITHROMAX) 250 MG tablet Take 1 tablet (250 mg total) by mouth daily. Take first 2 tablets together, then 1 every day until finished., Starting Sun 11/21/2016, Print    benzonatate (TESSALON) 100 MG capsule Take 1 capsule (100 mg total) by mouth every 8 (eight) hours., Starting Sun 11/21/2016, Print         I personally performed the services in this documentation, which was scribed in my presence.  The recorded information has been reviewed and considered.   Barnet PallKaren SofiaPAC.   Elson AreasLeslie K Sofia, PA-C 11/21/16 [address]1614    Leslie K PeoriaSofia, PA-C 11/21/16 1614    Cathren LaineKevin Steinl, MD 11/22/16 [phone]

## 2016-11-21 NOTE — ED Triage Notes (Signed)
Pt reports cough with thick greenish sputum, sore throat and nasal congestion x 4 days.

## 2019-03-20 ENCOUNTER — Encounter: Payer: Self-pay | Admitting: Orthopaedic Surgery

## 2019-06-27 ENCOUNTER — Other Ambulatory Visit: Payer: Self-pay

## 2019-06-27 ENCOUNTER — Ambulatory Visit (HOSPITAL_COMMUNITY)
Admission: RE | Admit: 2019-06-27 | Discharge: 2019-06-27 | Disposition: A | Discharge status: Home / Self Care | Payer: 59 | Source: Ambulatory Visit | Attending: Family Medicine | Admitting: Family Medicine

## 2019-06-27 ENCOUNTER — Other Ambulatory Visit (HOSPITAL_COMMUNITY): Payer: Self-pay | Admitting: Family Medicine

## 2019-06-27 DIAGNOSIS — R0602 Shortness of breath: Secondary | ICD-10-CM | POA: Diagnosis not present

## 2019-06-27 DIAGNOSIS — R05 Cough: Secondary | ICD-10-CM | POA: Insufficient documentation

## 2019-06-27 DIAGNOSIS — R059 Cough, unspecified: Secondary | ICD-10-CM

## 2019-07-27 ENCOUNTER — Encounter: Payer: 59 | Admitting: Obstetrics & Gynecology

## 2019-08-02 ENCOUNTER — Telehealth: Payer: Self-pay | Admitting: *Deleted

## 2019-08-02 NOTE — Telephone Encounter (Signed)
Patient cancelled for colpo. Patient called back upset.  I returned her call to inform her that I had Dr Elonda Husky review her pap smear results and based on the new criteria, she does not need a pap for 3 years.  Her last pap in 2015 was negative but if she had any questions or concerns, to please call our office.

## 2019-08-03 ENCOUNTER — Encounter: Payer: 59 | Admitting: Obstetrics & Gynecology

## 2019-12-30 ENCOUNTER — Other Ambulatory Visit: Payer: Self-pay

## 2019-12-30 ENCOUNTER — Ambulatory Visit
Admission: EM | Admit: 2019-12-30 | Discharge: 2019-12-30 | Disposition: A | Discharge status: Home / Self Care | Payer: Managed Care, Other (non HMO) | Attending: Emergency Medicine | Admitting: Emergency Medicine

## 2019-12-30 DIAGNOSIS — J069 Acute upper respiratory infection, unspecified: Secondary | ICD-10-CM

## 2019-12-30 DIAGNOSIS — Z20822 Contact with and (suspected) exposure to covid-19: Secondary | ICD-10-CM

## 2019-12-30 MED ORDER — FLUTICASONE PROPIONATE 50 MCG/ACT NA SUSP: 2.0000 | Freq: Every day | NASAL | 0 refills | Status: DC

## 2019-12-30 MED ORDER — BENZONATATE 100 MG PO CAPS: 100.0000 mg | ORAL_CAPSULE | Freq: Three times a day (TID) | ORAL | 0 refills | Status: DC

## 2019-12-30 MED ORDER — CETIRIZINE HCL 10 MG PO TABS: 10.0000 mg | ORAL_TABLET | Freq: Every day | ORAL | 0 refills | Status: DC

## 2019-12-30 NOTE — ED Triage Notes (Signed)
Pt presents with c/o nasal congestion , headache and cough. Pt has had positive exposure last week

## 2019-12-30 NOTE — ED Provider Notes (Signed)
[place]   [id number] 12/30/19 Arrival Time: 7412   CC: COVID symptoms  SUBJECTIVE: History from: patient.  Roberta Castro is a 44 y.o. female who presents with fatigue, headache, nasal congestion, and cough x 2-3 days.  Admits to positive COVID exposure last week.  Has NOT tried OTC medication.  Reports previous symptoms in the past with the flu.   Denies fever, chills, sore throat, SOB, wheezing, chest pain, nausea, vomiting, changes in bowel or bladder habits.    ROS: As per HPI.  All other pertinent ROS negative.     Past Medical History:  Diagnosis Date  . Chest pain on exertion 02/10/2016  . Fatigue 02/10/2016  . Smoker 02/10/2016  . Vaginal Pap smear, abnormal    Past Surgical History:  Procedure Laterality Date  . APPENDECTOMY    . tubes in ears     No Known Allergies No current facility-administered medications on file prior to encounter.   Current Outpatient Medications on File Prior to Encounter  Medication Sig Dispense Refill  . acetaminophen (TYLENOL) 500 MG tablet Take 500 mg by mouth every 6 (six) hours as needed.     Social History   Socioeconomic History  . Marital status: Married    Spouse name: Not on file  . Number of children: Not on file  . Years of education: Not on file  . Highest education level: Not on file  Occupational History  . Not on file  Tobacco Use  . Smoking status: Current Every Day Smoker    Packs/day: 0.50    Years: 20.00    Pack years: 10.00    Types: Cigarettes    Start date: 03/17/1992  . Smokeless tobacco: Never Used  Substance and Sexual Activity  . Alcohol use: Yes    Alcohol/week: 0.0 standard drinks    Comment: occ.  . Drug use: No  . Sexual activity: Yes    Birth control/protection: None  Other Topics Concern  . Not on file  Social History Narrative  . Not on file   Social Determinants of Health   Financial Resource Strain:   . Difficulty of Paying Living Expenses: Not on file  Food Insecurity:    . Worried About Charity fundraiser in the Last Year: Not on file  . Ran Out of Food in the Last Year: Not on file  Transportation Needs:   . Lack of Transportation (Medical): Not on file  . Lack of Transportation (Non-Medical): Not on file  Physical Activity:   . Days of Exercise per Week: Not on file  . Minutes of Exercise per Session: Not on file  Stress:   . Feeling of Stress : Not on file  Social Connections:   . Frequency of Communication with Friends and Family: Not on file  . Frequency of Social Gatherings with Friends and Family: Not on file  . Attends Religious Services: Not on file  . Active Member of Clubs or Organizations: Not on file  . Attends Archivist Meetings: Not on file  . Marital Status: Not on file  Intimate Partner Violence:   . Fear of Current or Ex-Partner: Not on file  . Emotionally Abused: Not on file  . Physically Abused: Not on file  . Sexually Abused: Not on file   Family History  Problem Relation Age of Onset  . Hypertension Mother   . Kidney disease Mother   . Depression Mother   . COPD Mother   . Arthritis  Mother   . Cancer Sister        breast  . Cancer Maternal Grandmother        breast, lung, throat  . Cancer Sister        cervical  . Cancer Maternal Aunt        breast    OBJECTIVE:  Vitals:   12/30/19 0934  BP: 112/86  Pulse: 73  Resp: 18  Temp: 97.8 F (36.6 C)  SpO2: 99%    General appearance: alert; appears fatigued, but nontoxic; speaking in full sentences and tolerating own secretions HEENT: NCAT; Ears: EACs clear, TMs pearly gray; Eyes: PERRL.  EOM grossly intact. Nose: nares patent without rhinorrhea, Throat: oropharynx clear, tonsils non erythematous or enlarged, uvula midline  Neck: supple without LAD Lungs: unlabored respirations, symmetrical air entry; cough: moderate; no respiratory distress; CTAB Heart: regular rate and rhythm.  Skin: warm and dry Psychological: alert and cooperative; normal mood  and affect  ASSESSMENT & PLAN:  1. Suspected COVID-19 virus infection   2. Viral URI with cough     Meds ordered this encounter  Medications  . cetirizine (ZYRTEC) 10 MG tablet    Sig: Take 1 tablet (10 mg total) by mouth daily.    Dispense:  30 tablet    Refill:  0    Order Specific Question:   Supervising Provider    Answer:   Eustace Moore [[id number]]  . fluticasone (FLONASE) 50 MCG/ACT nasal spray    Sig: Place 2 sprays into both nostrils daily.    Dispense:  16 g    Refill:  0    Order Specific Question:   Supervising Provider    Answer:   Eustace Moore [[id number]]  . benzonatate (TESSALON) 100 MG capsule    Sig: Take 1 capsule (100 mg total) by mouth every 8 (eight) hours.    Dispense:  21 capsule    Refill:  0    Order Specific Question:   Supervising Provider    Answer:   Eustace Moore [[id number]]   COVID testing ordered.  It will take between 2-7 days for test results.  Someone will contact you regarding abnormal results.    In the meantime: You should remain isolated in your home for 10 days from symptom onset AND greater than 72 hours after symptoms resolution (absence of fever without the use of fever-reducing medication and improvement in respiratory symptoms), whichever is longer Get plenty of rest and push fluids Tessalon Perles prescribed for cough zyrtec for nasal congestion, runny nose, and/or sore throat flonase for nasal congestion and runny nose Use OTC medications like ibuprofen or tylenol as needed fever or pain Follow up with PCP regarding test results Call or go to the ED if you have any new or worsening symptoms such as fever, worsening cough, shortness of breath, chest tightness, chest pain, turning blue, changes in mental status, etc...   Reviewed expectations re: course of current medical issues. Questions answered. Outlined signs and symptoms indicating need for more acute intervention. Patient verbalized understanding. After Visit  Summary given.         Rennis Harding, PA-C 12/30/19 [phone]

## 2019-12-30 NOTE — Discharge Instructions (Addendum)
COVID testing ordered.  It will take between 2-7 days for test results.  Someone will contact you regarding abnormal results.    In the meantime: You should remain isolated in your home for 10 days from symptom onset AND greater than 72 hours after symptoms resolution (absence of fever without the use of fever-reducing medication and improvement in respiratory symptoms), whichever is longer Get plenty of rest and push fluids Tessalon Perles prescribed for cough zyrtec for nasal congestion, runny nose, and/or sore throat flonase for nasal congestion and runny nose Use OTC medications like ibuprofen or tylenol as needed fever or pain Follow up with PCP regarding test results Call or go to the ED if you have any new or worsening symptoms such as fever, worsening cough, shortness of breath, chest tightness, chest pain, turning blue, changes in mental status, etc..Marland Kitchen

## 2019-12-31 LAB — NOVEL CORONAVIRUS, NAA: SARS-CoV-2, NAA: NOT DETECTED

## 2020-02-07 ENCOUNTER — Emergency Department (HOSPITAL_COMMUNITY)
Admission: EM | Admit: 2020-02-07 | Discharge: 2020-02-07 | Disposition: A | Discharge status: Home / Self Care | Payer: 59 | Attending: Emergency Medicine | Admitting: Emergency Medicine

## 2020-02-07 ENCOUNTER — Encounter (HOSPITAL_COMMUNITY): Payer: Self-pay | Admitting: Emergency Medicine

## 2020-02-07 ENCOUNTER — Other Ambulatory Visit: Payer: Self-pay

## 2020-02-07 DIAGNOSIS — N946 Dysmenorrhea, unspecified: Secondary | ICD-10-CM | POA: Diagnosis not present

## 2020-02-07 DIAGNOSIS — M545 Low back pain, unspecified: Secondary | ICD-10-CM

## 2020-02-07 DIAGNOSIS — F17210 Nicotine dependence, cigarettes, uncomplicated: Secondary | ICD-10-CM | POA: Insufficient documentation

## 2020-02-07 DIAGNOSIS — G8929 Other chronic pain: Secondary | ICD-10-CM

## 2020-02-07 LAB — COMPREHENSIVE METABOLIC PANEL
ALT: 23 U/L (ref 0–44)
AST: 20 U/L (ref 15–41)
Albumin: 4 g/dL (ref 3.5–5.0)
Alkaline Phosphatase: 54 U/L (ref 38–126)
Anion gap: 7 (ref 5–15)
BUN: 16 mg/dL (ref 6–20)
CO2: 26 mmol/L (ref 22–32)
Calcium: 9.1 mg/dL (ref 8.9–10.3)
Chloride: 103 mmol/L (ref 98–111)
Creatinine, Ser: 0.62 mg/dL (ref 0.44–1.00)
GFR calc Af Amer: >60 mL/min (ref >60)
GFR calc non Af Amer: >60 mL/min (ref >60)
Glucose, Bld: 87 mg/dL (ref 70–99)
Potassium: 3.6 mmol/L (ref 3.5–5.1)
Sodium: 136 mmol/L (ref 135–145)
Total Bilirubin: 0.3 mg/dL (ref 0.3–1.2)
Total Protein: 7.3 g/dL (ref 6.5–8.1)

## 2020-02-07 LAB — URINALYSIS, ROUTINE W REFLEX MICROSCOPIC
Bacteria, UA: NONE SEEN
Bilirubin Urine: NEGATIVE
Glucose, UA: NEGATIVE mg/dL
Ketones, ur: NEGATIVE mg/dL
Leukocytes,Ua: NEGATIVE
Nitrite: NEGATIVE
Protein, ur: NEGATIVE mg/dL
Specific Gravity, Urine: 1.002 — ABNORMAL LOW (ref 1.005–1.030)
pH: 7 (ref 5.0–8.0)

## 2020-02-07 LAB — CBC
HCT: 43.5 % (ref 36.0–46.0)
Hemoglobin: 14.6 g/dL (ref 12.0–15.0)
MCH: 31.4 pg (ref 26.0–34.0)
MCHC: 33.6 g/dL (ref 30.0–36.0)
MCV: 93.5 fL (ref 80.0–100.0)
Platelets: 283 10*3/uL (ref 150–400)
RBC: 4.65 MIL/uL (ref 3.87–5.11)
RDW: 12.6 % (ref 11.5–15.5)
WBC: 9.5 10*3/uL (ref 4.0–10.5)
nRBC: 0 % (ref 0.0–0.2)

## 2020-02-07 LAB — PREGNANCY, URINE: Preg Test, Ur: NEGATIVE

## 2020-02-07 MED ORDER — IBUPROFEN 600 MG PO TABS: 600.0000 mg | ORAL_TABLET | Freq: Four times a day (QID) | ORAL | 0 refills | Status: AC | PRN

## 2020-02-07 NOTE — Discharge Instructions (Signed)
Please follow-up with your OB/GYN provider.  It is possible you have uterine fibroids.  You likely need an ultrasound of your pelvis to better evaluate this region.  Your blood work today was reassuring.  You have no signs of anemia.  You do not appear dehydrated.  Your kidney function is normal.  I recommended that you use Motrin 600 mg every 6 hours as needed for pain and cramping. This is particularly useful on the days you are having your menstrual period.

## 2020-02-07 NOTE — Consult Note (Signed)
Followed by Dr Emelda Fear GYN  Has had a abnormal pap, but ?  Did not reach threshold for biopsy  Also reports back pain for 3 weeks   Does not know if it is related to her cervical issues, ?menopausal sx, or irregular periods

## 2020-02-07 NOTE — ED Provider Notes (Signed)
[facility] Provider Note   CSN: [id number] Arrival date & time: 02/07/20  1651     History Chief Complaint  Patient presents with  . Back Pain    Roberta Castro is a 44 y.o. female history of an irregular Pap smear presented to emergency department with general malaise and fatigue ongoing for several months, also reported back pain for several weeks or months, and irregular menses.  She reports her main complaint is lower back pain which is bothering her for a few weeks.  This has been constant pain in the lower back, mostly on the right side, which seems to wrap around towards her groin.  She denies dysuria or history of kidney stones.  She says this pain seems to be unrelated to her menstrual cycles.  She has been using "Goodies" for pain, with minimal relief.  She reports separately that she has had irregular menses recently.  She is having 2 menstrual periods per month.  These can last a few days at a time tend to have heavy bleeding.  She follows with an OB/GYN doctor who recently did a Pap smear.  She may need to have a biopsy done.  She has never had a pelvic ultrasound.  She is unaware of any history of uterine fibroids or ovarian cyst.  She is a monogamous relationship with her husband.  She denies any vaginal discharge.  She denies any fevers or chills.  HPI     Past Medical History:  Diagnosis Date  . Chest pain on exertion 02/10/2016  . Fatigue 02/10/2016  . Smoker 02/10/2016  . Vaginal Pap smear, abnormal     Patient Active Problem List   Diagnosis Date Noted  . Smoker 02/10/2016  . Fatigue 02/10/2016  . Chest pain on exertion 02/10/2016    Past Surgical History:  Procedure Laterality Date  . APPENDECTOMY    . tubes in ears       OB History    Gravida  1   Para  1   Term      Preterm      AB      Living  1     SAB      TAB      Ectopic      Multiple      Live Births  1           Family History  Problem Relation  Age of Onset  . Hypertension Mother   . Kidney disease Mother   . Depression Mother   . COPD Mother   . Arthritis Mother   . Cancer Sister        breast  . Cancer Maternal Grandmother        breast, lung, throat  . Cancer Sister        cervical  . Cancer Maternal Aunt        breast    Social History   Tobacco Use  . Smoking status: Current Every Day Smoker    Packs/day: 1.00    Years: 20.00    Pack years: 20.00    Types: Cigarettes    Start date: 03/17/1992  . Smokeless tobacco: Never Used  Substance Use Topics  . Alcohol use: Yes    Alcohol/week: 0.0 standard drinks    Comment: occ.  . Drug use: No    Home Medications Prior to Admission medications   Medication Sig Start Date End Date Taking? Authorizing Provider  Aspirin-Acetaminophen-Caffeine (GOODY HEADACHE  PO) Take 1 packet by mouth 3 (three) times daily as needed.   Yes [provider]  ibuprofen (ADVIL) 600 MG tablet Take 1 tablet (600 mg total) by mouth every 6 (six) hours as needed for up to 30 doses. 02/07/20   Terald Sleeper, MD    Allergies    Patient has no known allergies.  Review of Systems   Review of Systems  Constitutional: Negative for chills and fever.  Respiratory: Negative for cough and shortness of breath.   Cardiovascular: Negative for chest pain and palpitations.  Gastrointestinal: Negative for abdominal pain and vomiting.  Genitourinary: Positive for menstrual problem. Negative for dysuria, hematuria, vaginal discharge and vaginal pain.  Musculoskeletal: Positive for back pain. Negative for arthralgias.  Skin: Negative for pallor and rash.  Neurological: Negative for seizures and syncope.  All other systems reviewed and are negative.   Physical Exam Updated Vital Signs BP 117/85 (BP Location: Right Arm)   Pulse 74   Temp 98.4 F (36.9 C)   Resp 16   Ht 5\' 3"  (1.6 m)   Wt 63.5 kg   LMP 02/05/2020   SpO2 100%   BMI 24.80 kg/m   Physical Exam Vitals and nursing  note reviewed.  Constitutional:      General: She is not in acute distress.    Appearance: She is well-developed.  HENT:     Head: Normocephalic and atraumatic.  Eyes:     Conjunctiva/sclera: Conjunctivae normal.  Cardiovascular:     Rate and Rhythm: Normal rate and regular rhythm.  Pulmonary:     Effort: Pulmonary effort is normal. No respiratory distress.  Abdominal:     General: There is no distension.     Palpations: Abdomen is soft. There is no mass.     Tenderness: There is no abdominal tenderness. There is no guarding.  Musculoskeletal:        General: No swelling or tenderness. Normal range of motion.     Cervical back: Normal range of motion and neck supple.  Skin:    General: Skin is warm and dry.  Neurological:     Mental Status: She is alert.     ED Results / Procedures / Treatments   Labs (all labs ordered are listed, but only abnormal results are displayed) Labs Reviewed  URINALYSIS, ROUTINE W REFLEX MICROSCOPIC - Abnormal; Notable for the following components:      Result Value   Color, Urine COLORLESS (*)    Specific Gravity, Urine 1.002 (*)    Hgb urine dipstick SMALL (*)    All other components within normal limits  COMPREHENSIVE METABOLIC PANEL  CBC  PREGNANCY, URINE    EKG None  Radiology No results found.  Procedures Procedures (including critical care time)  Medications Ordered in ED Medications - No data to display  ED Course  I have reviewed the triage vital signs and the nursing notes.  Pertinent labs & imaging results that were available during my care of the patient were reviewed by me and considered in my medical decision making (see chart for details).  44 yo female presenting with dysmenorrhea and complaint of lower back pain.  Well appearing on exam.  She had an abnormal pap smear as an outpatient, and is scheduled to f/u with OBGYN soon.  We discussed the possibility of uterine fibroids or other intraabdominal mass as the  cause of her complaints, particularly her back pain.  Fibroids could be consistent with her dysmenorrhea.  However her  abnormal pap also raised the concern for malignancy.  I advised that she discuss an outpatient pelvic ultrasound with her obgyn.  I do not think this is emergent, but should be done in an expedited manner as an outpatient.  Her labs are unremarkable.  Hgb wnl.  UA without sign of infection.  Afebrile.  Doubt acute infectious/inflammatory intra-abdominal process.  Doubt PID or torsion based on this clinical picture.   Final Clinical Impression(s) / ED Diagnoses Final diagnoses:  Chronic midline low back pain without sciatica  Dysmenorrhea    Rx / DC Orders ED Discharge Orders         Ordered    ibuprofen (ADVIL) 600 MG tablet  Every 6 hours PRN     02/07/20 1927           Terald Sleeper, MD 02/08/20 1320

## 2020-02-07 NOTE — ED Triage Notes (Signed)
Patient complains of left side lower back pain with irregular menstruation for the past 3 weeks.

## 2020-03-05 ENCOUNTER — Other Ambulatory Visit: Payer: Self-pay | Admitting: Obstetrics and Gynecology

## 2020-03-05 DIAGNOSIS — R928 Other abnormal and inconclusive findings on diagnostic imaging of breast: Secondary | ICD-10-CM

## 2020-03-26 ENCOUNTER — Ambulatory Visit
Admission: RE | Admit: 2020-03-26 | Discharge: 2020-03-26 | Disposition: A | Discharge status: Home / Self Care | Payer: 59 | Source: Ambulatory Visit | Attending: Obstetrics and Gynecology | Admitting: Obstetrics and Gynecology

## 2020-03-26 ENCOUNTER — Other Ambulatory Visit: Payer: Self-pay

## 2020-03-26 ENCOUNTER — Other Ambulatory Visit: Payer: Self-pay | Admitting: Obstetrics and Gynecology

## 2020-03-26 DIAGNOSIS — N6310 Unspecified lump in the right breast, unspecified quadrant: Secondary | ICD-10-CM

## 2020-03-26 DIAGNOSIS — R921 Mammographic calcification found on diagnostic imaging of breast: Secondary | ICD-10-CM

## 2020-03-26 DIAGNOSIS — R928 Other abnormal and inconclusive findings on diagnostic imaging of breast: Secondary | ICD-10-CM

## 2020-09-26 ENCOUNTER — Ambulatory Visit
Admission: RE | Admit: 2020-09-26 | Discharge: 2020-09-26 | Disposition: A | Discharge status: Home / Self Care | Payer: 59 | Source: Ambulatory Visit | Attending: Obstetrics and Gynecology | Admitting: Obstetrics and Gynecology

## 2020-09-26 ENCOUNTER — Other Ambulatory Visit: Payer: Self-pay

## 2020-09-26 ENCOUNTER — Other Ambulatory Visit: Payer: Self-pay | Admitting: Obstetrics and Gynecology

## 2020-09-26 DIAGNOSIS — N6310 Unspecified lump in the right breast, unspecified quadrant: Secondary | ICD-10-CM

## 2020-09-26 DIAGNOSIS — R921 Mammographic calcification found on diagnostic imaging of breast: Secondary | ICD-10-CM

## 2020-12-12 ENCOUNTER — Other Ambulatory Visit: Payer: 59

## 2021-03-30 ENCOUNTER — Other Ambulatory Visit: Payer: 59

## 2021-06-16 ENCOUNTER — Ambulatory Visit
Admission: RE | Admit: 2021-06-16 | Discharge: 2021-06-16 | Disposition: A | Discharge status: Home / Self Care | Payer: 59 | Source: Ambulatory Visit | Attending: Obstetrics and Gynecology | Admitting: Obstetrics and Gynecology

## 2021-06-16 ENCOUNTER — Other Ambulatory Visit: Payer: Self-pay

## 2021-06-16 DIAGNOSIS — N6310 Unspecified lump in the right breast, unspecified quadrant: Secondary | ICD-10-CM

## 2021-06-16 DIAGNOSIS — R921 Mammographic calcification found on diagnostic imaging of breast: Secondary | ICD-10-CM

## 2021-07-21 ENCOUNTER — Ambulatory Visit
Admission: RE | Admit: 2021-07-21 | Discharge: 2021-07-21 | Disposition: A | Discharge status: Home / Self Care | Payer: 59 | Source: Ambulatory Visit | Attending: Family Medicine | Admitting: Family Medicine

## 2021-07-21 ENCOUNTER — Other Ambulatory Visit: Payer: Self-pay

## 2021-07-21 VITALS — BP 133/69 | HR 74 | Temp 98.0°F | Resp 16

## 2021-07-21 DIAGNOSIS — T63621A Toxic effect of contact with other jellyfish, accidental (unintentional), initial encounter: Secondary | ICD-10-CM | POA: Diagnosis not present

## 2021-07-21 DIAGNOSIS — L539 Erythematous condition, unspecified: Secondary | ICD-10-CM

## 2021-07-21 MED ORDER — PREDNISONE 20 MG PO TABS: 40.0000 mg | ORAL_TABLET | Freq: Every day | ORAL | 0 refills | Status: AC

## 2021-07-21 MED ORDER — TRIAMCINOLONE ACETONIDE 0.1 % EX CREA: 1.0000 "application " | TOPICAL_CREAM | Freq: Three times a day (TID) | CUTANEOUS | 0 refills | Status: AC

## 2021-07-21 MED ORDER — DEXAMETHASONE SODIUM PHOSPHATE 10 MG/ML IJ SOLN
10.0000 mg | Freq: Once | INTRAMUSCULAR | Status: AC
  Administered 2021-07-21: 10 mg via INTRAMUSCULAR

## 2021-07-21 NOTE — ED Triage Notes (Signed)
Jelly fish sting on 8/4.  States area seems to be spreading and still stings.  States area also itches

## 2021-07-21 NOTE — Discharge Instructions (Addendum)
Start prednisone 40 mg tomorrow with breakfast and take daily for 5 days.  Apply triamcinolone cream to rash 3 times daily until rash clears.

## 2021-07-21 NOTE — ED Provider Notes (Signed)
[facility]    CSN: [id number] Arrival date & time: 07/21/21  1700      History   Chief Complaint No chief complaint on file.   HPI Roberta Castro is a 45 y.o. female.   HPI Patient present today with itchy rash LLE following a sting injury from jellyfish which occurred > one week ago. She did not seek medical attention following the sting attack. Concern as rash is very itchy and is not resolving with OTC medication. Denies fever, nausea, or vomiting.   Past Medical History:  Diagnosis Date   Chest pain on exertion 02/10/2016   Fatigue 02/10/2016   Smoker 02/10/2016   Vaginal Pap smear, abnormal     Patient Active Problem List   Diagnosis Date Noted   Smoker 02/10/2016   Fatigue 02/10/2016   Chest pain on exertion 02/10/2016    Past Surgical History:  Procedure Laterality Date   APPENDECTOMY     tubes in ears      OB History     Gravida  1   Para  1   Term      Preterm      AB      Living  1      SAB      IAB      Ectopic      Multiple      Live Births  1            Home Medications    Prior to Admission medications   Medication Sig Start Date End Date Taking? Authorizing Provider  triamcinolone cream (KENALOG) 0.1 % Apply 1 application topically 3 (three) times daily. 07/21/21  Yes Bing Neighbors, FNP  Aspirin-Acetaminophen-Caffeine (GOODY HEADACHE PO) Take 1 packet by mouth 3 (three) times daily as needed.    [provider]  ibuprofen (ADVIL) 600 MG tablet Take 1 tablet (600 mg total) by mouth every 6 (six) hours as needed for up to 30 doses. 02/07/20   Terald Sleeper, MD    Family History Family History  Problem Relation Age of Onset   Hypertension Mother    Kidney disease Mother    Depression Mother    COPD Mother    Arthritis Mother    Cancer Sister        breast   Cancer Maternal Grandmother        breast, lung, throat   Cancer Sister        cervical   Cancer Maternal Aunt        breast     Social History Social History   Tobacco Use   Smoking status: Every Day    Packs/day: 1.00    Years: 20.00    Pack years: 20.00    Types: Cigarettes    Start date: 03/17/1992   Smokeless tobacco: Never  Substance Use Topics   Alcohol use: Yes    Alcohol/week: 0.0 standard drinks    Comment: occ.   Drug use: No     Allergies   Patient has no known allergies.   Review of Systems Review of Systems Pertinent negatives listed in HPI   Physical Exam Triage Vital Signs ED Triage Vitals [07/21/21 1737]  Enc Vitals Group     BP 133/69     Pulse Rate 74     Resp 16     Temp 98 F (36.7 C)     Temp Source Oral     SpO2 98 %  Weight      Height      Head Circumference      Peak Flow      Pain Score 0     Pain Loc      Pain Edu?      Excl. in GC?    No data found.  Updated Vital Signs BP 133/69 (BP Location: Right Arm)   Pulse 74   Temp 98 F (36.7 C) (Oral)   Resp 16   LMP 06/29/2021 (Approximate)   SpO2 98%   Visual Acuity Right Eye Distance:   Left Eye Distance:   Bilateral Distance:    Right Eye Near:   Left Eye Near:    Bilateral Near:     Physical Exam General appearance: Alert, well developed, well nourished, cooperative Head: Normocephalic, without obvious abnormality, atraumatic Respiratory: Respirations even and unlabored, normal respiratory rate Heart: Rate and rhythm normal.  Extremities: No gross deformities Skin: Macular erythematous rash posterior left leg non drainage  Neurologic: GCS 15, normal coordination, normal gait Psychological: Appropriate mood and affect  UC Treatments / Results  Labs (all labs ordered are listed, but only abnormal results are displayed) Labs Reviewed - No data to display  EKG   Radiology No results found.  Procedures Procedures (including critical care time)  Medications Ordered in UC Medications  dexamethasone (DECADRON) injection 10 mg (10 mg Intramuscular Given 07/21/21 1826)     Initial Impression / Assessment and Plan / UC Course  I have reviewed the triage vital signs and the nursing notes.  Pertinent labs & imaging results that were available during my care of the patient were reviewed by me and considered in my medical decision making (see chart for details).    Negative for any signs of systemic illness.  VSS. No respiratory system involvement  during acute phase of sting. Management focused on resolving rash. Treatment per discharge medication orders. Return precautions given Final Clinical Impressions(s) / UC Diagnoses   Final diagnoses:  Jellyfish sting, accidental or unintentional, initial encounter  Acute erythematous eruption of skin     Discharge Instructions      Start prednisone 40 mg tomorrow with breakfast and take daily for 5 days.  Apply triamcinolone cream to rash 3 times daily until rash clears.     ED Prescriptions     Medication Sig Dispense Auth. Provider   predniSONE (DELTASONE) 20 MG tablet Take 2 tablets (40 mg total) by mouth daily with breakfast for 5 days. 10 tablet Bing Neighbors, FNP   triamcinolone cream (KENALOG) 0.1 % Apply 1 application topically 3 (three) times daily. 454 g Bing Neighbors, FNP      PDMP not reviewed this encounter.   Bing Neighbors, [place] 07/28/21 [phone]

## 2022-02-17 ENCOUNTER — Other Ambulatory Visit: Payer: Self-pay

## 2022-02-17 ENCOUNTER — Ambulatory Visit
Admission: RE | Admit: 2022-02-17 | Discharge: 2022-02-17 | Disposition: A | Discharge status: Home / Self Care | Payer: Self-pay | Source: Ambulatory Visit | Attending: Family Medicine | Admitting: Family Medicine

## 2022-02-17 VITALS — BP 138/82 | HR 87 | Temp 97.7°F | Resp 16

## 2022-02-17 DIAGNOSIS — N390 Urinary tract infection, site not specified: Secondary | ICD-10-CM | POA: Insufficient documentation

## 2022-02-17 LAB — POCT URINALYSIS DIP (MANUAL ENTRY)
Bilirubin, UA: NEGATIVE
Glucose, UA: NEGATIVE mg/dL
Ketones, POC UA: NEGATIVE mg/dL
Nitrite, UA: NEGATIVE
Protein Ur, POC: NEGATIVE mg/dL
Spec Grav, UA: 1.02 (ref 1.010–1.025)
Urobilinogen, UA: 0.2 E.U./dL
pH, UA: 7 (ref 5.0–8.0)

## 2022-02-17 LAB — POCT URINE PREGNANCY: Preg Test, Ur: NEGATIVE

## 2022-02-17 MED ORDER — CEPHALEXIN 500 MG PO CAPS: 500.0000 mg | ORAL_CAPSULE | Freq: Two times a day (BID) | ORAL | 0 refills | Status: AC

## 2022-02-17 NOTE — ED Provider Notes (Signed)
?[facility] ? ? ? ?CSN: [id number] ?Arrival date & time: 02/17/22  1658 ? ? ?  ? ?History   ?Chief Complaint ?Chief Complaint  ?Patient presents with  ? UTI symptoms  ? ? ?HPI ?Roberta Castro is a 46 y.o. female.  ? ?Presenting today with 3-day history of dysuria, mild left lower quadrant discomfort, urgency, hesitancy.  Denies fever, chills, flank pain, hematuria, nausea, vomiting, vaginal symptoms, concern for STIs or pregnancy.  Not trying anything over-the-counter for symptoms.  Denies any history of similar issues. ? ? ?Past Medical History:  ?Diagnosis Date  ? Chest pain on exertion 02/10/2016  ? Fatigue 02/10/2016  ? Smoker 02/10/2016  ? Vaginal Pap smear, abnormal   ? ? ?Patient Active Problem List  ? Diagnosis Date Noted  ? Smoker 02/10/2016  ? Fatigue 02/10/2016  ? Chest pain on exertion 02/10/2016  ? ? ?Past Surgical History:  ?Procedure Laterality Date  ? APPENDECTOMY    ? tubes in ears    ? ? ?OB History   ? ? Gravida  ?1  ? Para  ?1  ? Term  ?   ? Preterm  ?   ? AB  ?   ? Living  ?1  ?  ? ? SAB  ?   ? IAB  ?   ? Ectopic  ?   ? Multiple  ?   ? Live Births  ?1  ?   ?  ?  ? ? ? ?Home Medications   ? ?Prior to Admission medications   ?Medication Sig Start Date End Date Taking? Authorizing Provider  ?cephALEXin (KEFLEX) 500 MG capsule Take 1 capsule (500 mg total) by mouth 2 (two) times daily. 02/17/22  Yes Particia NearingLane, Paola Aleshire Elizabeth, PA-C  ?Aspirin-Acetaminophen-Caffeine (GOODY HEADACHE PO) Take 1 packet by mouth 3 (three) times daily as needed.    [provider]  ?ESTROGEL 0.75 MG/1.25 GM (0.06%) topical gel SMARTSIG:Topical 02/05/22   [provider]  ?ibuprofen (ADVIL) 600 MG tablet Take 1 tablet (600 mg total) by mouth every 6 (six) hours as needed for up to 30 doses. 02/07/20   Terald Sleeperrifan, Matthew J, MD  ?progesterone (PROMETRIUM) 100 MG capsule progesterone micronized 100 mg capsule ? TAKE 1 CAPSULE BY MOUTH AT BEDTIME    [provider]  ?triamcinolone cream (KENALOG) 0.1 %  Apply 1 application topically 3 (three) times daily. 07/21/21   Bing NeighborsHarris, Kimberly S, FNP  ? ? ?Family History ?Family History  ?Problem Relation Age of Onset  ? Hypertension Mother   ? Kidney disease Mother   ? Depression Mother   ? COPD Mother   ? Arthritis Mother   ? Cancer Sister   ?     breast  ? Cancer Maternal Grandmother   ?     breast, lung, throat  ? Cancer Sister   ?     cervical  ? Cancer Maternal Aunt   ?     breast  ? ? ?Social History ?Social History  ? ?Tobacco Use  ? Smoking status: Every Day  ?  Packs/day: 1.00  ?  Years: 20.00  ?  Pack years: 20.00  ?  Types: Cigarettes  ?  Start date: 03/17/1992  ? Smokeless tobacco: Never  ?Vaping Use  ? Vaping Use: Never used  ?Substance Use Topics  ? Alcohol use: Yes  ?  Alcohol/week: 0.0 standard drinks  ?  Comment: occ.  ? Drug use: No  ? ? ? ?Allergies   ?Patient  has no known allergies. ? ? ?Review of Systems ?Review of Systems ?Per HPI ? ?Physical Exam ?Triage Vital Signs ?ED Triage Vitals [02/17/22 1707]  ?Enc Vitals Group  ?   BP 138/82  ?   Pulse Rate 87  ?   Resp 16  ?   Temp 97.7 ?F (36.5 ?C)  ?   Temp Source Oral  ?   SpO2 98 %  ?   Weight   ?   Height   ?   Head Circumference   ?   Peak Flow   ?   Pain Score 0  ?   Pain Loc   ?   Pain Edu?   ?   Excl. in GC?   ? ?No data found. ? ?Updated Vital Signs ?BP 138/82 (BP Location: Right Arm)   Pulse 87   Temp 97.7 ?F (36.5 ?C) (Oral)   Resp 16   SpO2 98%  ? ?Visual Acuity ?Right Eye Distance:   ?Left Eye Distance:   ?Bilateral Distance:   ? ?Right Eye Near:   ?Left Eye Near:    ?Bilateral Near:    ? ?Physical Exam ?Vitals and nursing note reviewed.  ?Constitutional:   ?   Appearance: Normal appearance. She is not ill-appearing.  ?HENT:  ?   Head: Atraumatic.  ?Eyes:  ?   Extraocular Movements: Extraocular movements intact.  ?   Conjunctiva/sclera: Conjunctivae normal.  ?Cardiovascular:  ?   Rate and Rhythm: Normal rate and regular rhythm.  ?   Heart sounds: Normal heart sounds.  ?Pulmonary:  ?   Effort:  Pulmonary effort is normal.  ?   Breath sounds: Normal breath sounds.  ?Abdominal:  ?   General: Bowel sounds are normal. There is no distension.  ?   Palpations: Abdomen is soft.  ?   Tenderness: There is abdominal tenderness. There is no right CVA tenderness, left CVA tenderness or guarding.  ?   Comments: Minimal suprapubic tenderness to palpation  ?Musculoskeletal:     ?   General: Normal range of motion.  ?   Cervical back: Normal range of motion and neck supple.  ?Skin: ?   General: Skin is warm and dry.  ?Neurological:  ?   Mental Status: She is alert and oriented to person, place, and time.  ?   Motor: No weakness.  ?   Gait: Gait normal.  ?Psychiatric:     ?   Mood and Affect: Mood normal.     ?   Thought Content: Thought content normal.     ?   Judgment: Judgment normal.  ? ?UC Treatments / Results  ?Labs ?(all labs ordered are listed, but only abnormal results are displayed) ?Labs Reviewed  ?POCT URINALYSIS DIP (MANUAL ENTRY) - Abnormal; Notable for the following components:  ?    Result Value  ? Blood, UA moderate (*)   ? Leukocytes, UA Trace (*)   ? All other components within normal limits  ?URINE CULTURE  ?POCT URINE PREGNANCY  ? ?EKG ? ?Radiology ?No results found. ? ?Procedures ?Procedures (including critical care time) ? ?Medications Ordered in UC ?Medications - No data to display ? ?Initial Impression / Assessment and Plan / UC Course  ?I have reviewed the triage vital signs and the nursing notes. ? ?Pertinent labs & imaging results that were available during my care of the patient were reviewed by me and considered in my medical decision making (see chart for details). ? ?  ? ?UA  with trace leuks, will send out for urine culture and treat with Keflex in the meantime.  Discussed Azo, fluids and other supportive measures.  Return for acutely worsening symptoms. ? ?Final Clinical Impressions(s) / UC Diagnoses  ? ?Final diagnoses:  ?Acute lower UTI  ? ?Discharge Instructions   ?None ?  ? ?ED  Prescriptions   ? ? Medication Sig Dispense Auth. Provider  ? cephALEXin (KEFLEX) 500 MG capsule Take 1 capsule (500 mg total) by mouth 2 (two) times daily. 14 capsule Particia Nearing, [place]  ? ?  ? ?PDMP not reviewed this encounter. ?  ?Particia Nearing, PA-C ?02/17/22 1752 ? ?

## 2022-02-17 NOTE — ED Triage Notes (Signed)
Pt states she thinks she has a UTI starting on Sunday ? ?Pt states she is have burning sensation, pain in the left side of her abdomin, urge to urinate but nothing comes out ? ?Denies Fever ? ?Denies Meds ?

## 2022-02-19 LAB — URINE CULTURE: Culture: NO GROWTH

## 2022-05-05 ENCOUNTER — Other Ambulatory Visit: Payer: Self-pay | Admitting: Obstetrics and Gynecology

## 2022-05-05 DIAGNOSIS — N6310 Unspecified lump in the right breast, unspecified quadrant: Secondary | ICD-10-CM

## 2022-05-05 DIAGNOSIS — N6459 Other signs and symptoms in breast: Secondary | ICD-10-CM | POA: Diagnosis not present

## 2022-05-12 ENCOUNTER — Other Ambulatory Visit: Payer: Self-pay | Admitting: Obstetrics and Gynecology

## 2022-05-12 ENCOUNTER — Ambulatory Visit
Admission: RE | Admit: 2022-05-12 | Discharge: 2022-05-12 | Disposition: A | Discharge status: Home / Self Care | Payer: 59 | Source: Ambulatory Visit | Attending: Obstetrics and Gynecology | Admitting: Obstetrics and Gynecology

## 2022-05-12 ENCOUNTER — Ambulatory Visit
Admission: RE | Admit: 2022-05-12 | Discharge: 2022-05-12 | Disposition: A | Discharge status: Home / Self Care | Payer: Self-pay | Source: Ambulatory Visit | Attending: Obstetrics and Gynecology | Admitting: Obstetrics and Gynecology

## 2022-05-12 DIAGNOSIS — R921 Mammographic calcification found on diagnostic imaging of breast: Secondary | ICD-10-CM | POA: Diagnosis not present

## 2022-05-12 DIAGNOSIS — N6310 Unspecified lump in the right breast, unspecified quadrant: Secondary | ICD-10-CM

## 2022-05-19 ENCOUNTER — Ambulatory Visit
Admission: RE | Admit: 2022-05-19 | Discharge: 2022-05-19 | Disposition: A | Discharge status: Home / Self Care | Payer: 59 | Source: Ambulatory Visit | Attending: Obstetrics and Gynecology | Admitting: Obstetrics and Gynecology

## 2022-05-19 DIAGNOSIS — N6310 Unspecified lump in the right breast, unspecified quadrant: Secondary | ICD-10-CM

## 2022-05-19 DIAGNOSIS — N6031 Fibrosclerosis of right breast: Secondary | ICD-10-CM | POA: Diagnosis not present

## 2022-05-19 DIAGNOSIS — N6313 Unspecified lump in the right breast, lower outer quadrant: Secondary | ICD-10-CM | POA: Diagnosis not present

## 2022-05-19 HISTORY — PX: BREAST BIOPSY: SHX20

## 2022-05-31 ENCOUNTER — Ambulatory Visit: Payer: Self-pay | Admitting: Surgery

## 2022-05-31 DIAGNOSIS — N6341 Unspecified lump in right breast, subareolar: Secondary | ICD-10-CM

## 2022-05-31 DIAGNOSIS — N6310 Unspecified lump in the right breast, unspecified quadrant: Secondary | ICD-10-CM | POA: Diagnosis not present

## 2022-06-01 ENCOUNTER — Ambulatory Visit: Payer: Self-pay | Admitting: Surgery

## 2022-06-01 NOTE — H&P (Signed)
Subjective    Chief Complaint: New Consultation (R breast )       History of Present Illness: Roberta Castro is a 46 y.o. female who is seen today as an office consultation at the request of Dr. Marcelle Overlie for evaluation of New Consultation (R breast ) .   This is a 46 year old female who has been followed for the last couple of years for benign-appearing right breast masses.  Recently she palpated some firmness in the retroareolar region of the right breast.  She underwent mammogram and ultrasound which revealed long-term stability of the calcifications in both breast and the hypoechoic masses at 9:00 and 10:00 in the right breast.  There is a new palpable solid and cystic mass at 7:00 in the retroareolar region of the right breast.  This measures 1.6 x 1.6 x 1.2 cm.  She underwent ultrasound-guided biopsy of this area which resulted in significant bleeding and subsequent ecchymosis.  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  This was felt to be discordant.  She is referred to [place] for excision of this area for definitive diagnosis.   Menopausal x 1 year   Review of Systems: A complete review of systems was obtained from the patient.  I have reviewed this information and discussed as appropriate with the patient.  See HPI as well for other ROS.   Review of Systems  Constitutional:  Positive for weight loss.  HENT: Negative.    Eyes: Negative.   Respiratory: Negative.    Cardiovascular: Negative.   Gastrointestinal: Negative.   Genitourinary: Negative.   Musculoskeletal: Negative.   Skin: Negative.   Neurological: Negative.   Endo/Heme/Allergies: Negative.   Psychiatric/Behavioral:  Positive for depression. The patient is nervous/anxious.        Medical History: Past Medical History      Past Medical History:  Diagnosis Date   Anxiety             Patient Active Problem List  Diagnosis   Smoker   Fatigue    PSH - appendectomy Endometrial polypectomy    Allergies  No Known Allergies           Current Outpatient Medications on File Prior to Visit  Medication Sig Dispense Refill   ESTROGEL 1.25 gram/actuation topical gel APPLY 1 PUMP TOPICALLY TO THE AFFECTED AREA EVERY NIGHT IN THE MORNING AND AT BEDTIME       progesterone (PROMETRIUM) 100 MG capsule Take 100 mg by mouth at bedtime        No current facility-administered medications on file prior to visit.      Family History  History reviewed. No pertinent family history.      Social History        Tobacco Use  Smoking Status Every Day   Types: Cigarettes  Smokeless Tobacco Never      Social History  Social History         Socioeconomic History   Marital status: Married  Tobacco Use   Smoking status: Every Day      Types: Cigarettes   Smokeless tobacco: Never  Vaping Use   Vaping Use: Never used  Substance and Sexual Activity   Alcohol use: Not Currently   Drug use: Never        Objective:         Vitals:    05/31/22 1114  BP: 130/70  Pulse: 98  Temp: 37.1 C (98.7 F)  SpO2: 99%  Weight: 54.3 kg (119 lb  9.6 oz)  Height: 160 cm (5\' 3" )    Body mass index is 21.19 kg/m.   Physical Exam    Constitutional:  WDWN in NAD, conversant, no obvious deformities; lying in bed comfortably Eyes:  Pupils equal, round; sclera anicteric; moist conjunctiva; no lid lag HENT:  Oral mucosa moist; good dentition  Neck:  No masses palpated, trachea midline; no thyromegaly Lungs:  CTA bilaterally; normal respiratory effort Breasts:  symmetric, bilateral fibrocystic changes; no nipple changes; no lymphadenopathy on either side.  Some indistinct firmness in the right lower outer retroareolar region. CV:  Regular rate and rhythm; no murmurs; extremities well-perfused with no edema Abd:  +bowel sounds, soft, non-tender, no palpable organomegaly; no palpable hernias Musc:  Normal gait; no apparent clubbing or cyanosis in extremities Lymphatic:  No palpable cervical or  axillary lymphadenopathy Skin:  Warm, dry; no sign of jaundice Psychiatric - alert and oriented x 4; calm mood and affect     Labs, Imaging and Diagnostic Testing:   Breast, right, needle core biopsy, 7:00 - APPARENT LARGE/ECTATIC DUCT WALL WITH IMMATURE FIBROSIS AND HISTIOCYTOSIS WITH LUMINAL FOAMY MACROPHAGES SUGGESTIVE OF DUCT RUPTURE. NOTE: THE CLINICAL IMPRESSION OF A MASS, SUSPECT PAPILLOMA, IS NOTED. WHILE PAPILLOMA IS NOT IDENTIFIED FINDINGS ARE CONSISTENT WITH DUCT ECTASIA/DILATED DUCT WHICH COULD BE SECONDARY TO AN ADJACENT PAPILLOMA THAT IS NOT IDENTIFIED (POSSIBLY SAMPLING). OF NOTE, THERE IS A MEDIUM SIZED ARTERY WITH HEMORRHAGE PRESENT. Microscopic Comment Larita Fife at [facility] was notified of the results on 05/20/2022. Orene Desanctis MD   CLINICAL DATA:  Two year followup for probably benign RIGHT breast masses and probably benign bilateral breast calcifications (screening study 03/03/2020). Today patient palpates tender, possible new mass in the retroareolar region of the RIGHT breast.   EXAM: DIGITAL DIAGNOSTIC BILATERAL MAMMOGRAM WITH TOMOSYNTHESIS AND CAD; ULTRASOUND RIGHT BREAST LIMITED   TECHNIQUE: Bilateral digital diagnostic mammography and breast tomosynthesis was performed. The images were evaluated with computer-aided detection.; Targeted ultrasound examination of the right breast was performed   COMPARISON:  Previous exam(s).   ACR Breast Density Category d: The breast tissue is extremely dense, which lowers the sensitivity of mammography.   FINDINGS: RIGHT BREAST:   Mammogram: Magnified views are performed of calcifications in the UPPER central RIGHT breast. Calcifications demonstrate layering on true LATERAL projection, consistent milk of calcium. Calcifications have no suspicious morphology or distribution. Mammographic images were processed with CAD.   Physical Exam: I palpate an oval smooth mass in the 7  o'clock retroareolar region of the RIGHT breast.   Ultrasound: Targeted ultrasound is performed, showing a circumscribed parallel mass in the 7 o'clock retroareolar region of the RIGHT breast which measures 1.6 x 1.6 x 1.2 centimeters. Portions are anechoic and other portions are hypoechoic. Color Doppler evaluation shows possible site of internal blood flow in the more solid component. Doppler also demonstrates probable mobility of the solid-appearing component, raising the question of complicated cyst.   In the 9 o'clock location 3 centimeters from the RIGHT nipple, an oval circumscribed mass is 0.6 x 0.5 x 0.4 centimeters.   In the 10 o'clock location 8 centimeters from nipple, an oval hypoechoic parallel mass is 0.8 x 0.7 x 0.5 centimeters.   Evaluation of the RIGHT axilla is negative for adenopathy.   LEFT BREAST:   Mammogram: Magnified views are performed of calcifications in the central LEFT breast. These views demonstrate round and layering calcifications, stable and consistent with benign milk of calcium. Mammographic images were processed with CAD.  IMPRESSION: 1. Long-term stability of calcifications in both breasts. 2. Long-term stability of hypoechoic masses in the 9 o'clock and 10 o'clock locations of the RIGHT breast. 3. Indeterminate palpable solid and cystic mass in the 7 o'clock retroareolar region of the RIGHT breast.   RECOMMENDATION: Recommend ultrasound-guided core biopsy of mass in the 7 o'clock retroareolar region of the RIGHT breast.   I have discussed the findings and recommendations with the patient. If applicable, a reminder letter will be sent to the patient regarding the next appointment.   BI-RADS CATEGORY  4: Suspicious.     Electronically Signed   By: Norva Pavlov M.D.   On: 05/12/2022 11:49   Assessment and Plan:  Diagnoses and all orders for this visit:   Mass of multiple sites of right breast     Due to the palpable mass  and the discordant biopsy, we recommend lumpectomy of this area under radioactive seed localization to provide definitive diagnosis and probable definitive treatment.  We will proceed with right radioactive seed localized lumpectomy of the palpable retroareolar mass.The surgical procedure has been discussed with the patient.  Potential risks, benefits, alternative treatments, and expected outcomes have been explained.  All of the patient's questions at this time have been answered.  The likelihood of reaching the patient's treatment goal is good.  The patient understand the proposed surgical procedure and wishes to proceed.       Leniya Breit Delbert Harness, MD  06/01/2022 10:41 AM

## 2022-06-30 DIAGNOSIS — Z01419 Encounter for gynecological examination (general) (routine) without abnormal findings: Secondary | ICD-10-CM | POA: Diagnosis not present

## 2022-06-30 DIAGNOSIS — Z124 Encounter for screening for malignant neoplasm of cervix: Secondary | ICD-10-CM | POA: Diagnosis not present

## 2022-06-30 DIAGNOSIS — Z113 Encounter for screening for infections with a predominantly sexual mode of transmission: Secondary | ICD-10-CM | POA: Diagnosis not present

## 2022-06-30 DIAGNOSIS — R69 Illness, unspecified: Secondary | ICD-10-CM | POA: Diagnosis not present

## 2022-06-30 DIAGNOSIS — Z6820 Body mass index (BMI) 20.0-20.9, adult: Secondary | ICD-10-CM | POA: Diagnosis not present

## 2022-12-21 ENCOUNTER — Ambulatory Visit
Admission: EM | Admit: 2022-12-21 | Discharge: 2022-12-21 | Disposition: A | Discharge status: Home / Self Care | Payer: 59 | Attending: Nurse Practitioner | Admitting: Nurse Practitioner

## 2022-12-21 ENCOUNTER — Encounter: Payer: Self-pay | Admitting: Emergency Medicine

## 2022-12-21 DIAGNOSIS — B349 Viral infection, unspecified: Secondary | ICD-10-CM | POA: Insufficient documentation

## 2022-12-21 DIAGNOSIS — Z1152 Encounter for screening for COVID-19: Secondary | ICD-10-CM | POA: Diagnosis not present

## 2022-12-21 MED ORDER — FLUTICASONE PROPIONATE 50 MCG/ACT NA SUSP: 2.0000 | Freq: Every day | NASAL | 0 refills | Status: AC

## 2022-12-21 MED ORDER — CETIRIZINE HCL 10 MG PO TABS: 10.0000 mg | ORAL_TABLET | Freq: Every day | ORAL | 0 refills | Status: AC

## 2022-12-21 MED ORDER — PSEUDOEPH-BROMPHEN-DM 30-2-10 MG/5ML PO SYRP: 5.0000 mL | ORAL_SOLUTION | Freq: Four times a day (QID) | ORAL | 0 refills | Status: AC | PRN

## 2022-12-21 NOTE — Discharge Instructions (Addendum)
COVID test is pending.  If your COVID test is positive, you will be contacted.  If you choose to do so, you are a candidate to receive Paxlovid as antiviral therapy. Increase fluids and allow for plenty rest. May take over-the-counter Tylenol or ibuprofen as needed for pain, fever, or general discomfort.  As discussed, you can take ibuprofen 600 mg every 8 hours to help with pain and to help slow vaginal bleeding.  Make sure you take this medication with food and water and do not take for prolonged periods of time. Recommend warm salt water gargles 3-4 times daily while throat symptoms persist. Rest your voice to help with your hoarseness. For the cough, it may be helpful for you to sleep elevated on pillows or to use a humidifier in your bedroom at nighttime during sleep. As discussed, viral illness can last from 7 to 14 days.  If your symptoms suddenly worsen before that time, or extend beyond that time, please follow-up with your primary care physician or in this clinic for further evaluation. Follow-up as needed.

## 2022-12-21 NOTE — ED Provider Notes (Signed)
[facility]    CSN: [id number] Arrival date & time: 12/21/22  1550      History   Chief Complaint No chief complaint on file.   HPI Roberta Castro is a 47 y.o. female.   The history is provided by the patient.   The patient presents with a 3-day history of bodyaches, headache, scratchy throat, nasal drainage, cough, and hoarseness.  Patient denies fever, chills, ear pain, wheezing, shortness of breath, difficulty breathing, or GI symptoms.  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  Roberta Castro reports that Roberta Castro has been taking NyQuil for her symptoms.  Past Medical History:  Diagnosis Date   Chest pain on exertion 02/10/2016   Fatigue 02/10/2016   Smoker 02/10/2016   Vaginal Pap smear, abnormal     Patient Active Problem List   Diagnosis Date Noted   Smoker 02/10/2016   Fatigue 02/10/2016   Chest pain on exertion 02/10/2016    Past Surgical History:  Procedure Laterality Date   APPENDECTOMY     tubes in ears      OB History     Gravida  1   Para  1   Term      Preterm      AB      Living  1      SAB      IAB      Ectopic      Multiple      Live Births  1            Home Medications    Prior to Admission medications   Medication Sig Start Date End Date Taking? Authorizing Provider  brompheniramine-pseudoephedrine-DM 30-2-10 MG/5ML syrup Take 5 mLs by mouth 4 (four) times daily as needed. 12/21/22  Yes Linsey Arteaga-Warren, Alda Lea, NP  cetirizine (ZYRTEC) 10 MG tablet Take 1 tablet (10 mg total) by mouth daily. 12/21/22  Yes Chyan Carnero-Warren, Alda Lea, NP  fluticasone (FLONASE) 50 MCG/ACT nasal spray Place 2 sprays into both nostrils daily. 12/21/22  Yes Coden Franchi-Warren, Alda Lea, NP  Aspirin-Acetaminophen-Caffeine (GOODY HEADACHE PO) Take 1 packet by mouth 3 (three) times daily as needed.    [provider]  cephALEXin (KEFLEX) 500 MG capsule Take 1 capsule (500 mg total) by mouth 2  (two) times daily. 02/17/22   Volney American, PA-C  ESTROGEL 0.75 MG/1.25 GM (0.06%) topical gel SMARTSIG:Topical 02/05/22   [provider]  ibuprofen (ADVIL) 600 MG tablet Take 1 tablet (600 mg total) by mouth every 6 (six) hours as needed for up to 30 doses. 02/07/20   Wyvonnia Dusky, MD  progesterone (PROMETRIUM) 100 MG capsule progesterone micronized 100 mg capsule  TAKE 1 CAPSULE BY MOUTH AT BEDTIME    [provider]  triamcinolone cream (KENALOG) 0.1 % Apply 1 application topically 3 (three) times daily. 07/21/21   Scot Jun, FNP    Family History Family History  Problem Relation Age of Onset   Hypertension Mother    Kidney disease Mother    Depression Mother    COPD Mother    Arthritis Mother    Cancer Sister        breast   Cancer Maternal Grandmother        breast, lung, throat   Cancer Sister        cervical   Cancer Maternal Aunt        breast    Social History  Social History   Tobacco Use   Smoking status: Every Day    Packs/day: 1.00    Years: 20.00    Total pack years: 20.00    Types: Cigarettes    Start date: 03/17/1992   Smokeless tobacco: Never  Vaping Use   Vaping Use: Never used  Substance Use Topics   Alcohol use: Yes    Alcohol/week: 0.0 standard drinks of alcohol    Comment: occ.   Drug use: No     Allergies   Patient has no known allergies.   Review of Systems Review of Systems Per HPI  Physical Exam Triage Vital Signs ED Triage Vitals  Enc Vitals Group     BP 12/21/22 1640 129/78     Pulse Rate 12/21/22 1640 80     Resp 12/21/22 1640 18     Temp 12/21/22 1640 98 F (36.7 C)     Temp Source 12/21/22 1640 Oral     SpO2 12/21/22 1640 99 %     Weight --      Height --      Head Circumference --      Peak Flow --      Pain Score 12/21/22 1641 8     Pain Loc --      Pain Edu? --      Excl. in GC? --    No data found.  Updated Vital Signs BP 129/78 (BP Location: Right Arm)   Pulse 80    Temp 98 F (36.7 C) (Oral)   Resp 18   SpO2 99%   Visual Acuity Right Eye Distance:   Left Eye Distance:   Bilateral Distance:    Right Eye Near:   Left Eye Near:    Bilateral Near:     Physical Exam Vitals and nursing note reviewed.  Constitutional:      General: Roberta Castro is not in acute distress.    Appearance: Normal appearance.  HENT:     Head: Normocephalic.     Right Ear: Tympanic membrane, ear canal and external ear normal.     Left Ear: Tympanic membrane, ear canal and external ear normal.     Nose: Congestion present. No rhinorrhea.     Mouth/Throat:     Mouth: Mucous membranes are moist.     Pharynx: Posterior oropharyngeal erythema present. No oropharyngeal exudate.  Eyes:     Extraocular Movements: Extraocular movements intact.     Conjunctiva/sclera: Conjunctivae normal.     Pupils: Pupils are equal, round, and reactive to light.  Cardiovascular:     Rate and Rhythm: Normal rate and regular rhythm.     Pulses: Normal pulses.     Heart sounds: Normal heart sounds.  Pulmonary:     Effort: Pulmonary effort is normal. No respiratory distress.     Breath sounds: Normal breath sounds. No stridor. No wheezing, rhonchi or rales.  Abdominal:     General: Bowel sounds are normal.     Palpations: Abdomen is soft.     Tenderness: There is no abdominal tenderness.  Musculoskeletal:     Cervical back: Normal range of motion.  Lymphadenopathy:     Cervical: No cervical adenopathy.  Skin:    General: Skin is warm and dry.  Neurological:     General: No focal deficit present.     Mental Status: Roberta Castro is alert and oriented to person, place, and time.  Psychiatric:        Mood and Affect: Mood normal.  Behavior: Behavior normal.     UC Treatments / Results  Labs (all labs ordered are listed, but only abnormal results are displayed) Labs Reviewed  SARS CORONAVIRUS 2 (TAT 6-24 HRS)    EKG   Radiology No results found.  Procedures Procedures (including  critical care time)  Medications Ordered in UC Medications - No data to display  Initial Impression / Assessment and Plan / UC Course  I have reviewed the triage vital signs and the nursing notes.  Pertinent labs & imaging results that were available during my care of the patient were reviewed by me and considered in my medical decision making (see chart for details).  The patient is well-appearing, Roberta Castro is in no acute distress, vital signs are stable.  Symptoms consistent with a viral illness.  Unable to perform testing for influenza at this time due to unavailability of resources.  COVID test is pending.  If COVID test is positive, patient is a candidate to receive Paxlovid.  Supportive care recommendations were provided to the patient to include increasing fluids, allowing for plenty of rest, and use of Tylenol or ibuprofen as needed for pain, fever, or general discomfort.  Symptomatic treatment was provided for the patient to include Bromfed-DM for her cough, fluticasone 50 mcg nasal spray for her nasal congestion, and cetirizine 10 mg sent with nasal congestion.  Patient verbalizes understanding.  All questions were answered.  Work note was provided.  Patient is stable for discharge.   Final Clinical Impressions(s) / UC Diagnoses   Final diagnoses:  Viral illness  Encounter for screening for COVID-19     Discharge Instructions      COVID test is pending.  If your COVID test is positive, you will be contacted.  If you choose to do so, you are a candidate to receive Paxlovid as antiviral therapy. Increase fluids and allow for plenty rest. May take over-the-counter Tylenol or ibuprofen as needed for pain, fever, or general discomfort.  As discussed, you can take ibuprofen 600 mg every 8 hours to help with pain and to help slow vaginal bleeding.  Make sure you take this medication with food and water and do not take for prolonged periods of time. Recommend warm salt water gargles 3-4  times daily while throat symptoms persist. Rest your voice to help with your hoarseness. For the cough, it may be helpful for you to sleep elevated on pillows or to use a humidifier in your bedroom at nighttime during sleep. As discussed, viral illness can last from 7 to 14 days.  If your symptoms suddenly worsen before that time, or extend beyond that time, please follow-up with your primary care physician or in this clinic for further evaluation. Follow-up as needed.     ED Prescriptions     Medication Sig Dispense Auth. Provider   brompheniramine-pseudoephedrine-DM 30-2-10 MG/5ML syrup Take 5 mLs by mouth 4 (four) times daily as needed. 140 mL Tukker Byrns-Warren, Alda Lea, NP   fluticasone (FLONASE) 50 MCG/ACT nasal spray Place 2 sprays into both nostrils daily. 16 g Matai Carpenito-Warren, Alda Lea, NP   cetirizine (ZYRTEC) 10 MG tablet Take 1 tablet (10 mg total) by mouth daily. 30 tablet Katanya Schlie-Warren, Alda Lea, NP      PDMP not reviewed this encounter.   Tish Men, NP 12/21/22 1905

## 2022-12-21 NOTE — ED Triage Notes (Signed)
Body aches, headache, some coughing, lost voice on Saturday.  Has been taking nyquil.

## 2022-12-22 LAB — SARS CORONAVIRUS 2 (TAT 6-24 HRS): SARS Coronavirus 2: NEGATIVE

## 2023-07-25 IMAGING — MG DIGITAL DIAGNOSTIC BILAT W/ TOMO W/ CAD
8 of 20 series · 8 of 40 positions shown · non-contrast
Comparison: Previous exam(s).

CLINICAL DATA: Two year followup for probably benign RIGHT breast
masses and probably benign bilateral breast calcifications
(screening study 03/03/2020). Today patient palpates tender,
possible new mass in the retroareolar region of the RIGHT breast.

EXAM:
DIGITAL DIAGNOSTIC BILATERAL MAMMOGRAM WITH TOMOSYNTHESIS AND CAD;
ULTRASOUND RIGHT BREAST LIMITED
TECHNIQUE: Bilateral digital diagnostic mammography and breast tomosynthesis
was performed. The images were evaluated with computer-aided
detection.; Targeted ultrasound examination of the right breast was
performed

[R CC (1 of 2)]
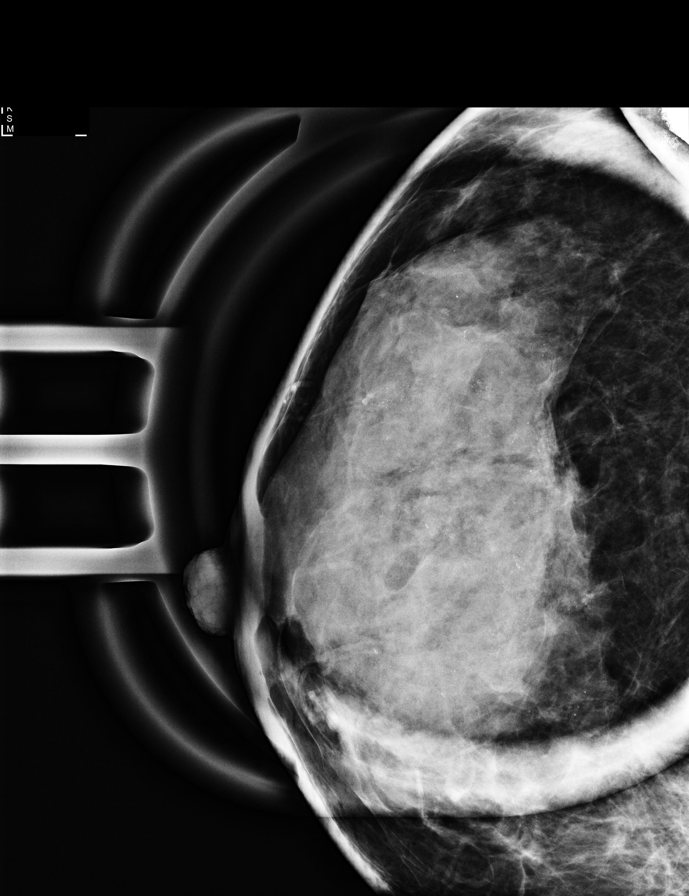

[R CC (2 of 2)]
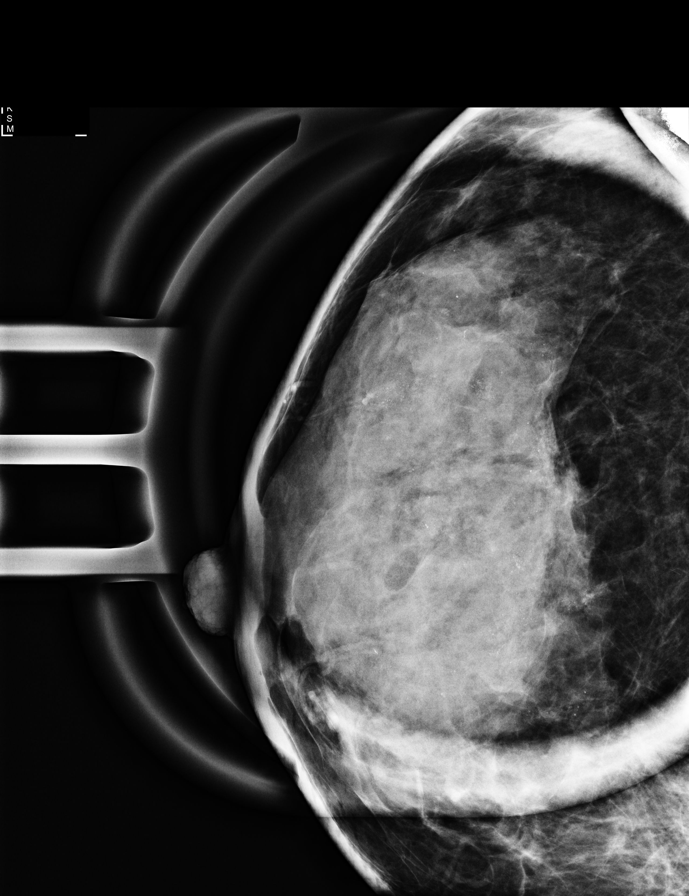

[L CC]
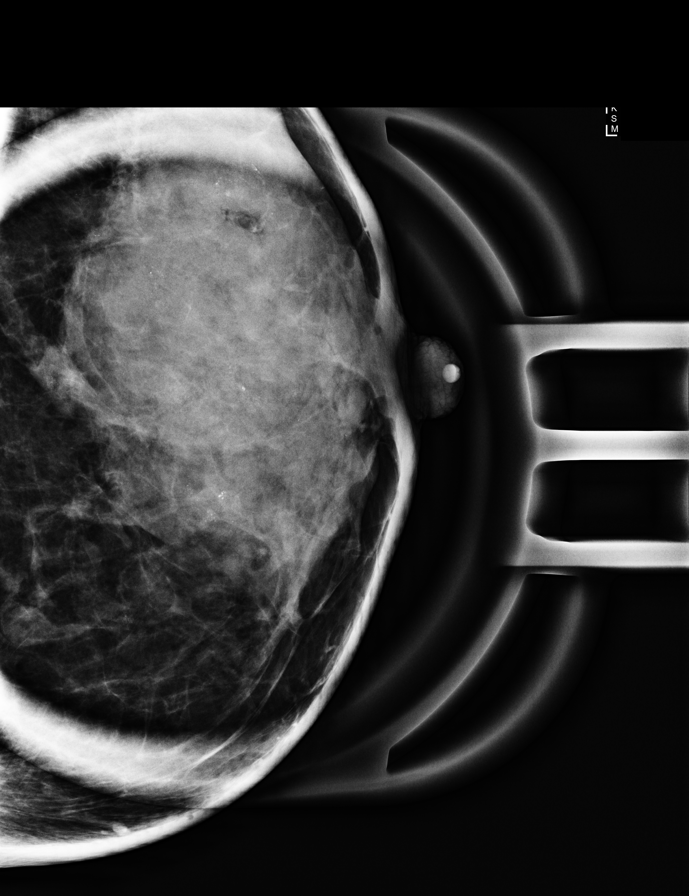

[R ML (1 of 2)]
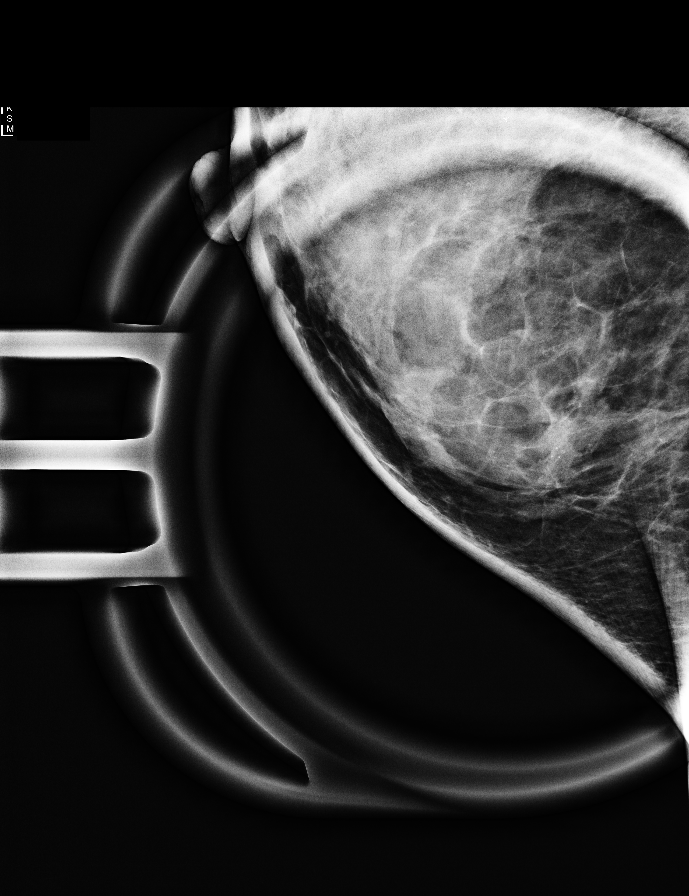

[R ML (2 of 2)]
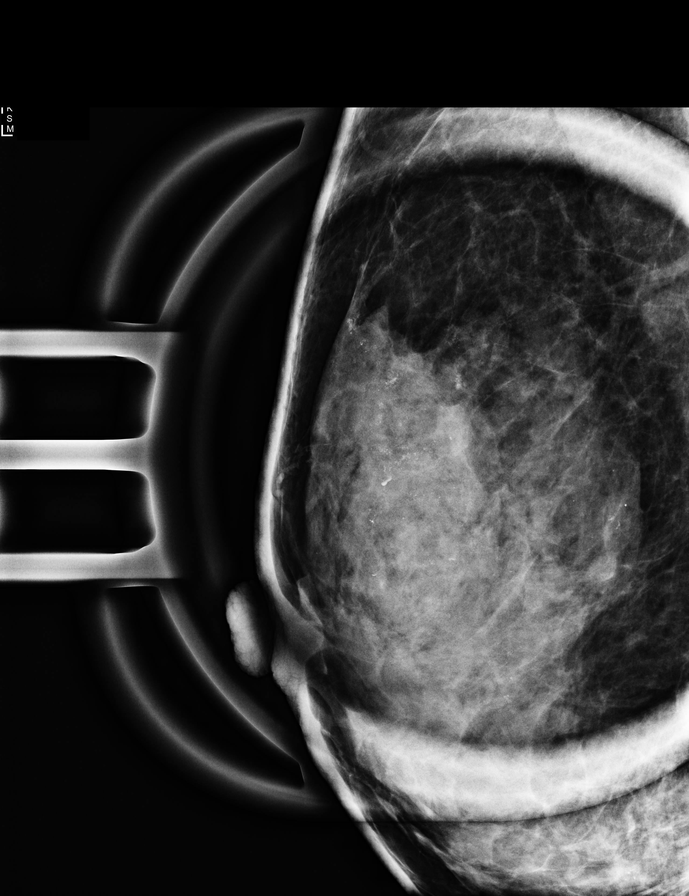

[L MLO synth-2D]
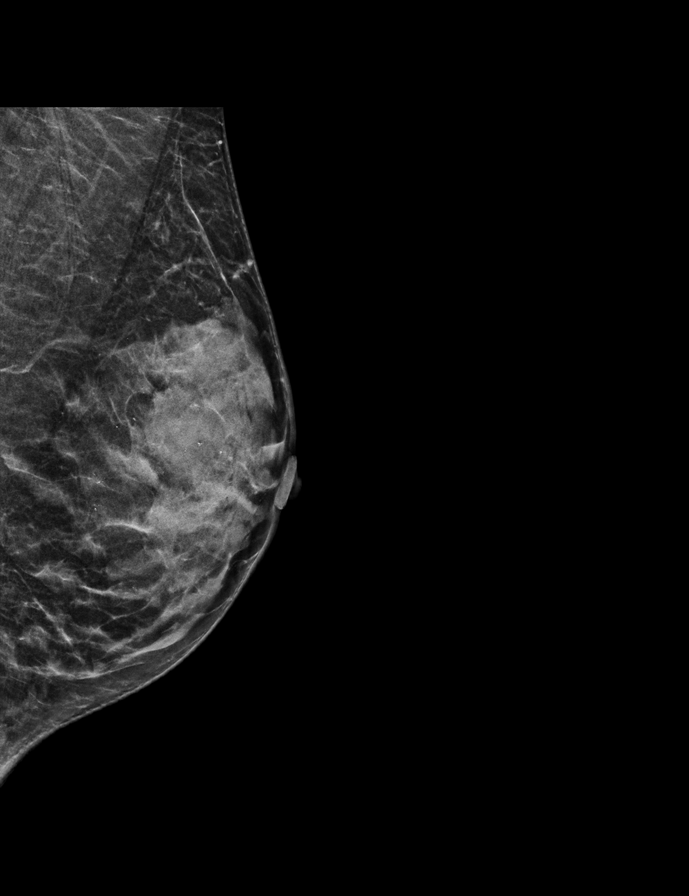

[R ML synth-2D]
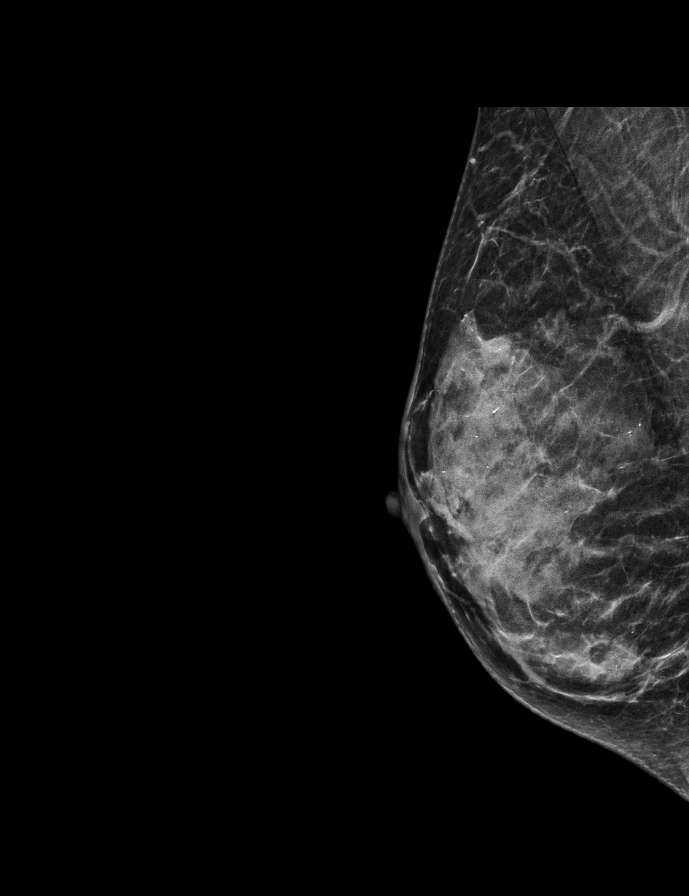

[R MLO synth-2D]
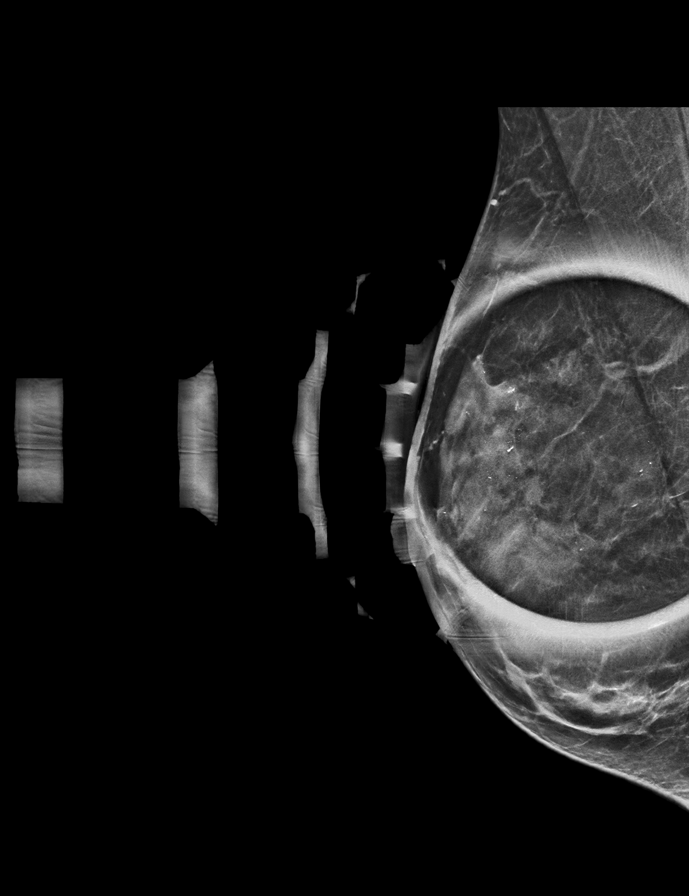

[8 of 40 positions shown; findings below may reference images not displayed]

ACR Breast Density Category d: The breast tissue is extremely dense,
which lowers the sensitivity of mammography.
FINDINGS: RIGHT BREAST:

Mammogram: Magnified views are performed of calcifications in the
UPPER central RIGHT breast. Calcifications demonstrate layering on
true LATERAL projection, consistent milk of calcium. Calcifications
have no suspicious morphology or distribution. Mammographic images
were processed with CAD.

Physical Exam: I palpate an oval smooth mass in the 7 o'clock
retroareolar region of the RIGHT breast.

Ultrasound: Targeted ultrasound is performed, showing a
circumscribed parallel mass in the 7 o'clock retroareolar region of
the RIGHT breast which measures 1.6 x 1.6 x 1.2 centimeters.
Portions are anechoic and other portions are hypoechoic. Color
Doppler evaluation shows possible site of internal blood flow in the
more solid component. Doppler also demonstrates probable mobility of
the solid-appearing component, raising the question of complicated
cyst.

In the 9 o'clock location 3 centimeters from the RIGHT nipple, an
oval circumscribed mass is 0.6 x 0.5 x 0.4 centimeters.

In the 10 o'clock location 8 centimeters from nipple, an oval
hypoechoic parallel mass is 0.8 x 0.7 x 0.5 centimeters.

Evaluation of the RIGHT axilla is negative for adenopathy.

LEFT BREAST:

Mammogram: Magnified views are performed of calcifications in the
central LEFT breast. These views demonstrate round and layering
calcifications, stable and consistent with benign milk of calcium.
Mammographic images were processed with CAD.
IMPRESSION: 1. Long-term stability of calcifications in both breasts.
2. Long-term stability of hypoechoic masses in the 9 o'clock and 10
o'clock locations of the RIGHT breast.
3. Indeterminate palpable solid and cystic mass in the 7 o'clock
retroareolar region of the RIGHT breast.

RECOMMENDATION:
Recommend ultrasound-guided core biopsy of mass in the 7 o'clock
retroareolar region of the RIGHT breast.

I have discussed the findings and recommendations with the patient.
If applicable, a reminder letter will be sent to the patient
regarding the next appointment.

BI-RADS CATEGORY  4: Suspicious.

## 2023-08-03 DIAGNOSIS — Z01419 Encounter for gynecological examination (general) (routine) without abnormal findings: Secondary | ICD-10-CM | POA: Diagnosis not present

## 2023-08-03 DIAGNOSIS — Z6820 Body mass index (BMI) 20.0-20.9, adult: Secondary | ICD-10-CM | POA: Diagnosis not present

## 2023-08-03 DIAGNOSIS — Z113 Encounter for screening for infections with a predominantly sexual mode of transmission: Secondary | ICD-10-CM | POA: Diagnosis not present

## 2023-10-05 DIAGNOSIS — N950 Postmenopausal bleeding: Secondary | ICD-10-CM | POA: Diagnosis not present

## 2023-12-28 ENCOUNTER — Other Ambulatory Visit: Payer: Self-pay | Admitting: Obstetrics and Gynecology

## 2023-12-28 DIAGNOSIS — N950 Postmenopausal bleeding: Secondary | ICD-10-CM | POA: Diagnosis not present

## 2023-12-28 DIAGNOSIS — N63 Unspecified lump in unspecified breast: Secondary | ICD-10-CM

## 2023-12-28 DIAGNOSIS — N644 Mastodynia: Secondary | ICD-10-CM

## 2024-01-06 ENCOUNTER — Other Ambulatory Visit: Payer: Self-pay | Admitting: Obstetrics and Gynecology

## 2024-01-06 ENCOUNTER — Ambulatory Visit
Admission: RE | Admit: 2024-01-06 | Discharge: 2024-01-06 | Disposition: A | Discharge status: Home / Self Care | Payer: 59 | Source: Ambulatory Visit | Attending: Obstetrics and Gynecology | Admitting: Obstetrics and Gynecology

## 2024-01-06 DIAGNOSIS — N644 Mastodynia: Secondary | ICD-10-CM

## 2024-01-06 DIAGNOSIS — N63 Unspecified lump in unspecified breast: Secondary | ICD-10-CM

## 2024-01-06 DIAGNOSIS — N6313 Unspecified lump in the right breast, lower outer quadrant: Secondary | ICD-10-CM | POA: Diagnosis not present
